# Patient Record
Sex: Male | Born: 1945 | Race: Black or African American | Hispanic: No | Marital: Single | Smoking: Never smoker
Health system: Southern US, Community
[De-identification: ages and names within clinical notes are randomized; demographics above are authoritative.]

## PROBLEM LIST (undated history)

## (undated) DIAGNOSIS — K219 Gastro-esophageal reflux disease without esophagitis: Secondary | ICD-10-CM

## (undated) DIAGNOSIS — D649 Anemia, unspecified: Secondary | ICD-10-CM

## (undated) DIAGNOSIS — F0390 Unspecified dementia without behavioral disturbance: Secondary | ICD-10-CM

## (undated) DIAGNOSIS — E119 Type 2 diabetes mellitus without complications: Secondary | ICD-10-CM

## (undated) DIAGNOSIS — N289 Disorder of kidney and ureter, unspecified: Secondary | ICD-10-CM

## (undated) DIAGNOSIS — G309 Alzheimer's disease, unspecified: Secondary | ICD-10-CM

## (undated) DIAGNOSIS — C61 Malignant neoplasm of prostate: Secondary | ICD-10-CM

## (undated) DIAGNOSIS — N179 Acute kidney failure, unspecified: Secondary | ICD-10-CM

## (undated) DIAGNOSIS — F0280 Dementia in other diseases classified elsewhere without behavioral disturbance: Secondary | ICD-10-CM

## (undated) DIAGNOSIS — I10 Essential (primary) hypertension: Secondary | ICD-10-CM

## (undated) DIAGNOSIS — E785 Hyperlipidemia, unspecified: Secondary | ICD-10-CM

## (undated) HISTORY — PX: PROSTATE BIOPSY: SHX241

---

## 1999-10-20 ENCOUNTER — Ambulatory Visit (HOSPITAL_COMMUNITY): Admission: RE | Admit: 1999-10-20 | Discharge: 1999-10-20 | Payer: Self-pay | Admitting: Pulmonary Disease

## 1999-10-20 ENCOUNTER — Encounter: Payer: Self-pay | Admitting: Pulmonary Disease

## 2000-08-05 ENCOUNTER — Ambulatory Visit (HOSPITAL_COMMUNITY): Admission: RE | Admit: 2000-08-05 | Discharge: 2000-08-05 | Payer: Self-pay | Admitting: Pulmonary Disease

## 2000-08-05 ENCOUNTER — Encounter: Payer: Self-pay | Admitting: Pulmonary Disease

## 2002-07-03 ENCOUNTER — Encounter: Payer: Self-pay | Admitting: Pulmonary Disease

## 2002-07-03 ENCOUNTER — Inpatient Hospital Stay (HOSPITAL_COMMUNITY): Admission: AD | Admit: 2002-07-03 | Discharge: 2002-07-06 | Payer: Self-pay | Admitting: Pulmonary Disease

## 2002-07-05 ENCOUNTER — Encounter: Payer: Self-pay | Admitting: Pulmonary Disease

## 2002-07-06 ENCOUNTER — Encounter: Payer: Self-pay | Admitting: Pulmonary Disease

## 2005-07-06 ENCOUNTER — Ambulatory Visit (HOSPITAL_COMMUNITY): Admission: RE | Admit: 2005-07-06 | Discharge: 2005-07-06 | Payer: Self-pay | Admitting: Pulmonary Disease

## 2008-10-02 ENCOUNTER — Ambulatory Visit (HOSPITAL_COMMUNITY): Admission: RE | Admit: 2008-10-02 | Discharge: 2008-10-02 | Payer: Self-pay | Admitting: Pulmonary Disease

## 2010-08-29 ENCOUNTER — Encounter: Payer: Self-pay | Admitting: Pulmonary Disease

## 2010-12-24 NOTE — Discharge Summary (Signed)
NAMEROMAINE, Cory Ramos                            ACCOUNT NO.:  [id number]   MEDICAL RECORD NO.:  [id number]                   PATIENT TYPE:  INP   LOCATION:  4736                                 FACILITY:  [facility]   PHYSICIAN:  Mina Marble, M.D.          DATE OF BIRTH:  06/21/1946   DATE OF ADMISSION:  07/03/2002  DATE OF DISCHARGE:  07/06/2002                                 DISCHARGE SUMMARY   ADMITTING DIAGNOSES:  1. Chest pain, anterior, with unstable angina suspected.  2. Type 2 diabetes mellitus, fair control.  3. Hypertensive heart disease.  4. Musculoskeletal chest pain.  5. Gastroesophageal reflux disease.   DISCHARGE DIAGNOSES:  1. Chest pain, angina, unstable, resolved.  2. Type 2 diabetes mellitus.  3. Hypertensive heart disease, improved.  4. Gastroesophageal reflux disease, hiatal hernia clinically.  5. Degenerative joint disease of extremities.  6. Musculoskeletal left anterior chest pain.   REASON FOR HOSPITALIZATION:  This is a 65 year old black male with known  type 2 diabetes mellitus on oral diabetic medication and hypertensive heart  disease, who is admitted with complaints of anterior substernal chest pain  radiating down the left arm has remained current in spite of use of his  Pepto-Bismol liquid he usually uses for acid reflux.  Substernal discomfort  for two weeks.  The patient also takes Nexium 40 mg capsule q.d. for acid  reflux.  He also complains of anterior wall pain that has been a long-time  problem for many years, but that is improved with Ben-Gay rub, but the new  pain is different and he is admitted for further evaluation and treatment.   ALLERGIES:  No known drug allergies.   LABORATORIES/STUDIES:  EKG on July 04, 2002, showed normal sinus rhythm,  nonspecific ST-T changes.  Chest x-ray on November 26, showed cardiomegaly  and early right lower lobe infiltrate.  A barium swallow on July 06, 2002, showed small hiatal hernia  without evidence of reflux.  There is no  evidence of mass effect musculature.  On July 04, 2002, CBC was within  normal limits, except hematocrit 37.3.  On November 26, chemistries within  normal limits, except glucose of 159.  On July 03, 2002, the first set  of cardiac enzymes show a CK of 216, CK MB of 2.9, index 1.3, troponin I  0.01.  The next two sets were within normal limits.  Liver profile,  cholesterol was 197, triglycerides were 137, HDL was 59, LDL was 119.   HOSPITAL COURSE:  The patient was admitted.  EKGs were obtained.  The  patient was admitted to  telemetry bed.  The patient was started on a  heparin drip and PTT was monitored.  He was started on hypertensive  management of Avapro and Norvasc.  He was given Darvocet-N 100 for pain and  also started on Plavix 75 mg and Imdur 30 mg p.o. q.d.  He had  a Cardiolite  stress test that was read by Dr. Algie Coffer which was negative for ischemia.  The heparin was discontinued.  The patient was continued on Protonix and  Pepto-Bismol and continued on his diabetic medications of Avandia and  Glyburide.  His Avapro was discontinued.  He was started on Lotensin 20 mg  p.o. q.d.  Added to his prior care also was Lipitor  10 mg q.h.s. and hydrochlorothiazide 12.5 tablets Monday, Wednesday, Friday.  Potassium supplement was added p.o.  The patient was able to progress to  have his ambulation in the hall.  He also had a barium swallow and upper GI  which was negative for mass or reflux, but he did have a small hiatal  hernia.  The patient was started on Bextra 10 mg p.o. q.d. and baby aspirin  81 mg tablets 1 q.d. with food.   CONDITION ON DISCHARGE:  The patient was discharged home and states he  continued having improvement of substernal chest pain.   DISCHARGE MEDICATIONS:  1. Norvasc 10 mg tablet 1 q.d.  2. Lotensin 20 mg tablet 1 q.d.  3. Baby aspirin 81 mg tablet 1 q.d. with food.  4. Plavix 75 mg tablet as directed which  is 1 daily p.r.n. for chest pain     radiating to the left arm.  5. Bextra 10 mg tablet 1 p.o. q.d.  6. _______ 12.5 mg tablet 1 q.d. with food for muscle or joint aches.  7. Glucovance 5/500 tablet 2 q.a.m. and 1 q.p.m. with meals.  8. Avandia 8 mg tablet 1 q.d. with 5 p.m. dinner meal.  9. Lipitor 10 mg tablet p.o. q.d.  10.      Klor-Con 20 mEq tablet 1 q.d. with breakfast.  11.      _______/HCT tablet Monday, Wednesday, Friday.  12.      Nexium 40 mg tablet 1 q.d. acid reflux.  13.      Pepto-Bismol 30 cc before meals and at bedtime with chest burning     and acid reflux.   DISCHARGE INSTRUCTIONS:  Diet:  1800 calorie ADA diet, no added salt.  Followup:  Keep February 2004 appointment.     Dionne D. Retta Mac, M.D.    DDR/MEDQ  D:  08/05/2002  T:  08/05/2002  Job:  [id number]

## 2011-03-24 ENCOUNTER — Other Ambulatory Visit (HOSPITAL_COMMUNITY): Payer: Self-pay | Admitting: Pulmonary Disease

## 2011-04-26 ENCOUNTER — Ambulatory Visit (HOSPITAL_COMMUNITY)
Admission: RE | Admit: 2011-04-26 | Discharge: 2011-04-26 | Disposition: A | Discharge status: Home / Self Care | Payer: Medicare Other | Source: Ambulatory Visit | Attending: Pulmonary Disease | Admitting: Pulmonary Disease

## 2011-04-26 ENCOUNTER — Other Ambulatory Visit (HOSPITAL_COMMUNITY): Payer: Self-pay | Admitting: Pulmonary Disease

## 2011-04-26 DIAGNOSIS — M545 Low back pain, unspecified: Secondary | ICD-10-CM | POA: Insufficient documentation

## 2011-04-26 DIAGNOSIS — Q762 Congenital spondylolisthesis: Secondary | ICD-10-CM | POA: Insufficient documentation

## 2011-04-26 DIAGNOSIS — R52 Pain, unspecified: Secondary | ICD-10-CM

## 2011-04-26 DIAGNOSIS — M79609 Pain in unspecified limb: Secondary | ICD-10-CM | POA: Insufficient documentation

## 2013-07-11 ENCOUNTER — Ambulatory Visit (HOSPITAL_COMMUNITY)
Admission: RE | Admit: 2013-07-11 | Discharge: 2013-07-11 | Disposition: A | Discharge status: Home / Self Care | Payer: Medicare Other | Source: Ambulatory Visit | Attending: Pulmonary Disease | Admitting: Pulmonary Disease

## 2013-07-11 ENCOUNTER — Other Ambulatory Visit (HOSPITAL_COMMUNITY): Payer: Self-pay | Admitting: Pulmonary Disease

## 2013-07-11 DIAGNOSIS — M25559 Pain in unspecified hip: Secondary | ICD-10-CM | POA: Insufficient documentation

## 2013-07-11 DIAGNOSIS — R52 Pain, unspecified: Secondary | ICD-10-CM

## 2013-07-11 DIAGNOSIS — R209 Unspecified disturbances of skin sensation: Secondary | ICD-10-CM | POA: Insufficient documentation

## 2015-04-22 ENCOUNTER — Ambulatory Visit: Payer: Medicare Other | Admitting: Podiatry

## 2015-04-28 ENCOUNTER — Ambulatory Visit: Payer: Medicare Other | Admitting: Podiatry

## 2015-09-25 ENCOUNTER — Emergency Department (HOSPITAL_COMMUNITY)
Admission: EM | Admit: 2015-09-25 | Discharge: 2015-09-25 | Disposition: A | Discharge status: Home / Self Care | Payer: Medicare Other | Source: Home / Self Care | Attending: Family Medicine | Admitting: Family Medicine

## 2015-09-25 ENCOUNTER — Encounter (HOSPITAL_COMMUNITY): Payer: Self-pay | Admitting: Emergency Medicine

## 2015-09-25 DIAGNOSIS — H1132 Conjunctival hemorrhage, left eye: Secondary | ICD-10-CM | POA: Diagnosis not present

## 2015-09-25 NOTE — ED Provider Notes (Signed)
CSN: [id number]     Arrival date & time 09/25/15  1410 History   First MD Initiated Contact with Patient 09/25/15 1552     Chief Complaint  Patient presents with  . Eye Problem   (Consider location/radiation/quality/duration/timing/severity/associated sxs/prior Treatment) HPI Comments: 70 year old male that awoke this morning with redness to the sclera on the left eye. Denies associated pain, vision changes, headache, or other symptoms. Denies pain around the eye. Denies any known trauma. His only other complaint is nasal stuffiness and pressure in the paranasal sinuses. No runny nose.   History reviewed. No pertinent past medical history. History reviewed. No pertinent past surgical history. No family history on file. Social History  Substance Use Topics  . Smoking status: None  . Smokeless tobacco: None  . Alcohol Use: None    Review of Systems  Constitutional: Negative.   HENT: Negative.   Eyes: Positive for redness. Negative for photophobia, pain, discharge, itching and visual disturbance.  Skin: Negative.   Neurological: Negative.   Psychiatric/Behavioral: Negative.     Allergies  Review of patient's allergies indicates no known allergies.  Home Medications   Prior to Admission medications   Not on File   Meds Ordered and Administered this Visit  Medications - No data to display  BP 164/76 mmHg  Pulse 66  Temp(Src) 97.6 F (36.4 C) (Oral)  Resp 14  SpO2 98% No data found.   Physical Exam  Constitutional: He is oriented to person, place, and time. He appears well-developed and well-nourished. No distress.  Eyes: EOM are normal. Pupils are equal, round, and reactive to light.  Medial and inferior subconjunctival hemorrhage of the left eye. no hyphema, anterior chamber is clear. Normal pupillary reaction.  Neck: Normal range of motion. Neck supple.  Cardiovascular: Normal rate.   Pulmonary/Chest: Effort normal. No respiratory distress.  Musculoskeletal: He  exhibits no edema.  Neurological: He is alert and oriented to person, place, and time. He exhibits normal muscle tone.  Skin: Skin is warm and dry.  Psychiatric: He has a normal mood and affect.  Nursing note and vitals reviewed.   ED Course  Procedures (including critical care time)  Labs Review Labs Reviewed - No data to display  Imaging Review No results found.   Visual Acuity Review  Right Eye Distance:   Left Eye Distance:   Bilateral Distance:    Right Eye Near:   Left Eye Near:    Bilateral Near:         MDM   1. Subconjunctival hemorrhage of left eye    Info on dx given    Janne Napoleon, NP 09/25/15 1641

## 2015-09-25 NOTE — Discharge Instructions (Signed)
Subconjunctival Hemorrhage °Subconjunctival hemorrhage is bleeding that happens between the white part of your eye (sclera) and the clear membrane that covers the outside of your eye (conjunctiva). There are many tiny blood vessels near the surface of your eye. A subconjunctival hemorrhage happens when one or more of these vessels breaks and bleeds, causing a red patch to appear on your eye. This is similar to a bruise. °Depending on the amount of bleeding, the red patch may only cover a small area of your eye or it may cover the entire visible part of the sclera. If a lot of blood collects under the conjunctiva, there may also be swelling. Subconjunctival hemorrhages do not affect your vision or cause pain, but your eye may feel irritated if there is swelling. Subconjunctival hemorrhages usually do not require treatment, and they disappear on their own within two weeks. °CAUSES °This condition may be caused by: °· Mild trauma, such as rubbing your eye too hard. °· Severe trauma or blunt injuries. °· Coughing, sneezing, or vomiting. °· Straining, such as when lifting a heavy object. °· High blood pressure. °· Recent eye surgery. °· A history of diabetes. °· Certain medicines, especially blood thinners (anticoagulants). °· Other conditions, such as eye tumors, bleeding disorders, or blood vessel abnormalities. °Subconjunctival hemorrhages can happen without an obvious cause.  °SYMPTOMS  °Symptoms of this condition include: °· A bright red or dark red patch on the white part of the eye. °¨ The red area may spread out to cover a larger area of the eye before it goes away. °¨ The red area may turn brownish-yellow before it goes away. °· Swelling. °· Mild eye irritation. °DIAGNOSIS °This condition is diagnosed with a physical exam. If your subconjunctival hemorrhage was caused by trauma, your health care provider may refer you to an eye specialist (ophthalmologist) or another specialist to check for other injuries. You  may have other tests, including: °· An eye exam. °· A blood pressure check. °· Blood tests to check for bleeding disorders. °If your subconjunctival hemorrhage was caused by trauma, X-rays or a CT scan may be done to check for other injuries. °TREATMENT °Usually, no treatment is needed. Your health care provider may recommend eye drops or cold compresses to help with discomfort. °HOME CARE INSTRUCTIONS °· Take over-the-counter and prescription medicines only as directed by your health care provider. °· Use eye drops or cold compresses to help with discomfort as directed by your health care provider. °· Avoid activities, things, and environments that may irritate or injure your eye. °· Keep all follow-up visits as told by your health care provider. This is important. °SEEK MEDICAL CARE IF: °· You have pain in your eye. °· The bleeding does not go away within 3 weeks. °· You keep getting new subconjunctival hemorrhages. °SEEK IMMEDIATE MEDICAL CARE IF: °· Your vision changes or you have difficulty seeing. °· You suddenly develop severe sensitivity to light. °· You develop a severe headache, persistent vomiting, confusion, or abnormal tiredness (lethargy). °· Your eye seems to bulge or protrude from your eye socket. °· You develop unexplained bruises on your body. °· You have unexplained bleeding in another area of your body. °  °This information is not intended to replace advice given to you by your health care provider. Make sure you discuss any questions you have with your health care provider. °  °Document Released: 07/25/2005 Document Revised: 04/15/2015 Document Reviewed: 10/01/2014 °Elsevier Interactive Patient Education ©2016 [employer] ° °

## 2015-09-25 NOTE — ED Notes (Signed)
Pt reports he woke up today w/redness of left eye... States he was fine yest night Denies inj/trauma/pain/blurry vision Also reports recent family stress/death in family A&O x4... No acute distress.

## 2016-02-23 ENCOUNTER — Other Ambulatory Visit (HOSPITAL_COMMUNITY): Payer: Self-pay | Admitting: Pulmonary Disease

## 2016-02-23 DIAGNOSIS — R0989 Other specified symptoms and signs involving the circulatory and respiratory systems: Secondary | ICD-10-CM

## 2016-02-26 ENCOUNTER — Inpatient Hospital Stay (HOSPITAL_COMMUNITY): Admission: RE | Admit: 2016-02-26 | Payer: Medicare Other | Source: Ambulatory Visit

## 2016-03-03 ENCOUNTER — Encounter (HOSPITAL_COMMUNITY): Payer: Medicare Other

## 2016-03-04 ENCOUNTER — Ambulatory Visit (HOSPITAL_COMMUNITY)
Admission: RE | Admit: 2016-03-04 | Discharge: 2016-03-04 | Disposition: A | Discharge status: Home / Self Care | Payer: Medicare Other | Source: Ambulatory Visit | Attending: Cardiology | Admitting: Cardiology

## 2016-03-04 DIAGNOSIS — R0989 Other specified symptoms and signs involving the circulatory and respiratory systems: Secondary | ICD-10-CM | POA: Insufficient documentation

## 2016-07-25 ENCOUNTER — Ambulatory Visit (HOSPITAL_COMMUNITY)
Admission: EM | Admit: 2016-07-25 | Discharge: 2016-07-25 | Disposition: A | Discharge status: Home / Self Care | Payer: Medicare Other | Attending: Family Medicine | Admitting: Family Medicine

## 2016-07-25 ENCOUNTER — Encounter (HOSPITAL_COMMUNITY): Payer: Self-pay | Admitting: Emergency Medicine

## 2016-07-25 DIAGNOSIS — K219 Gastro-esophageal reflux disease without esophagitis: Secondary | ICD-10-CM | POA: Diagnosis not present

## 2016-07-25 HISTORY — DX: Essential (primary) hypertension: I10

## 2016-07-25 HISTORY — DX: Type 2 diabetes mellitus without complications: E11.9

## 2016-07-25 HISTORY — DX: Gastro-esophageal reflux disease without esophagitis: K21.9

## 2016-07-25 MED ORDER — RANITIDINE HCL 150 MG PO TABS: 150.0000 mg | ORAL_TABLET | Freq: Every day | ORAL | 0 refills | Status: DC

## 2016-07-25 NOTE — Discharge Instructions (Signed)
Avoid acidic or spicey foods, follow up with primary care for symptom management.

## 2016-07-25 NOTE — ED Provider Notes (Signed)
CSN: [id number]     Arrival date & time 07/25/16  1504 History   First MD Initiated Contact with Patient 07/25/16 1540     Chief Complaint  Patient presents with  . Gastroesophageal Reflux   (Consider location/radiation/quality/duration/timing/severity/associated sxs/prior Treatment) Patient presents with history of GERD. States he has had symptoms for several months but has worsened over the last few weeks. Describes a burning pain mid sternal radiating to left shoulder, worsened with foods such as tomatoes, onion, and peppers. Takes pepto bismol and tums nightly with some relief, worse when laying down, has had some cough and sensation of reflux in the back of his throat.   The history is provided by the patient.  Gastroesophageal Reflux  This is a chronic problem. The current episode started more than 1 week ago. The problem occurs every several days. The problem has been gradually worsening. Associated symptoms include chest pain. Pertinent negatives include no abdominal pain and no shortness of breath. The symptoms are aggravated by eating. The symptoms are relieved by medications. The treatment provided mild relief.    Past Medical History:  Diagnosis Date  . Diabetes mellitus without complication ([place])   . GERD (gastroesophageal reflux disease)   . Hypertension    History reviewed. No pertinent surgical history. History reviewed. No pertinent family history. Social History  Substance Use Topics  . Smoking status: Never Smoker  . Smokeless tobacco: Never Used  . Alcohol use No    Review of Systems  Constitutional: Negative for activity change, appetite change, chills, fatigue and fever.  HENT: Positive for sore throat. Negative for congestion, postnasal drip, rhinorrhea and voice change.   Respiratory: Positive for cough. Negative for apnea, chest tightness, shortness of breath and wheezing.   Cardiovascular: Positive for chest pain.  Gastrointestinal: Negative for  abdominal distention, abdominal pain, constipation, diarrhea, nausea and vomiting.  Neurological: Negative for dizziness, weakness and light-headedness.    Allergies  Patient has no known allergies.  Home Medications   Prior to Admission medications   Medication Sig Start Date End Date Taking? Authorizing Provider  amiloride-hydrochlorothiazide (MODURETIC) 5-50 MG tablet Take by mouth.   Yes Historical Provider, MD  amLODipine-olmesartan (AZOR) 5-20 MG tablet Take 1 tablet by mouth daily.   Yes Historical Provider, MD  aspirin EC 81 MG tablet Take 81 mg by mouth daily.   Yes Historical Provider, MD  benazepril (LOTENSIN) 40 MG tablet Take 40 mg by mouth daily.   Yes Historical Provider, MD  colesevelam (WELCHOL) 625 MG tablet Take by mouth 2 (two) times daily with a meal.   Yes Historical Provider, MD  ezetimibe (ZETIA) 10 MG tablet Take 10 mg by mouth daily.   Yes Historical Provider, MD  fenofibrate micronized (LOFIBRA) 134 MG capsule Take 134 mg by mouth daily before breakfast.   Yes Historical Provider, MD  glipiZIDE-metformin (METAGLIP) 5-500 MG tablet Take 1 tablet by mouth 2 (two) times daily before a meal.   Yes Historical Provider, MD  omeprazole (PRILOSEC) 20 MG capsule Take 20 mg by mouth 2 (two) times daily before a meal.   Yes Historical Provider, MD  pioglitazone (ACTOS) 30 MG tablet Take 30 mg by mouth daily.   Yes Historical Provider, MD  tamsulosin (FLOMAX) 0.4 MG CAPS capsule Take 0.4 mg by mouth.   Yes Historical Provider, MD  Vitamin D, Ergocalciferol, (DRISDOL) 50000 units CAPS capsule Take 50,000 Units by mouth every 7 (seven) days.   Yes Historical Provider, MD  ranitidine (ZANTAC) 150 MG  tablet Take 1 tablet (150 mg total) by mouth at bedtime. 07/25/16 08/08/16  Barnet Glasgow, NP   Meds Ordered and Administered this Visit  Medications - No data to display  BP 163/59 (BP Location: Left Arm)   Pulse 81   Temp 98.6 F (37 C) (Oral)   Resp 18   SpO2 97%  No data  found.   Physical Exam  Constitutional: He is oriented to person, place, and time. He appears well-developed and well-nourished. No distress.  HENT:  Head: Normocephalic.  Eyes: Pupils are equal, round, and reactive to light.  Neck: Normal range of motion. Neck supple.  Cardiovascular: Normal rate, regular rhythm, S1 normal, S2 normal, normal heart sounds and intact distal pulses.   Pulses:      Radial pulses are 2+ on the right side.  Pulmonary/Chest: Effort normal and breath sounds normal. No respiratory distress. He has no wheezes. He has no rales.  Abdominal: Soft. Bowel sounds are normal. He exhibits no distension. There is no tenderness. There is no guarding.  Lymphadenopathy:    He has no cervical adenopathy.  Neurological: He is alert and oriented to person, place, and time.  Skin: Skin is warm and dry. He is not diaphoretic. No erythema.  Nursing note and vitals reviewed.   Urgent Care Course   Clinical Course     Procedures (including critical care time)  Labs Review Labs Reviewed - No data to display  12 lead EKG: Normal sinus rhythm without acute findings  Imaging Review No results found.   Visual Acuity Review  Right Eye Distance:   Left Eye Distance:   Bilateral Distance:    Right Eye Near:   Left Eye Near:    Bilateral Near:         MDM   1. Gastroesophageal reflux disease, esophagitis presence not specified    GERD Symptoms, RX Ranitidine 150 mg QHS, avoid spicy foods and acidic foods, follow-up with primary care for further management.     Barnet Glasgow, NP 07/25/16 [phone]

## 2016-07-25 NOTE — ED Triage Notes (Signed)
The patient presented to the [facility] with a complaint of recurrent GERD. The patient reported that it has gotten increasingly worse over the last month.

## 2016-12-27 ENCOUNTER — Other Ambulatory Visit (HOSPITAL_COMMUNITY): Payer: Medicare Other

## 2016-12-27 ENCOUNTER — Inpatient Hospital Stay (HOSPITAL_COMMUNITY): Payer: Medicare Other

## 2016-12-27 ENCOUNTER — Encounter (HOSPITAL_COMMUNITY): Payer: Self-pay | Admitting: Emergency Medicine

## 2016-12-27 ENCOUNTER — Observation Stay (HOSPITAL_COMMUNITY)
Admission: EM | Admit: 2016-12-27 | Discharge: 2016-12-29 | Disposition: A | Discharge status: Home / Self Care | Payer: Medicare Other | Attending: Internal Medicine | Admitting: Internal Medicine

## 2016-12-27 DIAGNOSIS — E875 Hyperkalemia: Secondary | ICD-10-CM

## 2016-12-27 DIAGNOSIS — Z7984 Long term (current) use of oral hypoglycemic drugs: Secondary | ICD-10-CM | POA: Diagnosis not present

## 2016-12-27 DIAGNOSIS — E785 Hyperlipidemia, unspecified: Secondary | ICD-10-CM | POA: Diagnosis not present

## 2016-12-27 DIAGNOSIS — N179 Acute kidney failure, unspecified: Principal | ICD-10-CM | POA: Insufficient documentation

## 2016-12-27 DIAGNOSIS — N183 Chronic kidney disease, stage 3 (moderate): Secondary | ICD-10-CM | POA: Diagnosis not present

## 2016-12-27 DIAGNOSIS — I129 Hypertensive chronic kidney disease with stage 1 through stage 4 chronic kidney disease, or unspecified chronic kidney disease: Secondary | ICD-10-CM | POA: Insufficient documentation

## 2016-12-27 DIAGNOSIS — I10 Essential (primary) hypertension: Secondary | ICD-10-CM | POA: Diagnosis not present

## 2016-12-27 DIAGNOSIS — D649 Anemia, unspecified: Secondary | ICD-10-CM

## 2016-12-27 DIAGNOSIS — E1122 Type 2 diabetes mellitus with diabetic chronic kidney disease: Secondary | ICD-10-CM

## 2016-12-27 DIAGNOSIS — F0390 Unspecified dementia without behavioral disturbance: Secondary | ICD-10-CM | POA: Diagnosis not present

## 2016-12-27 DIAGNOSIS — Z79899 Other long term (current) drug therapy: Secondary | ICD-10-CM | POA: Diagnosis not present

## 2016-12-27 DIAGNOSIS — K219 Gastro-esophageal reflux disease without esophagitis: Secondary | ICD-10-CM | POA: Diagnosis not present

## 2016-12-27 DIAGNOSIS — E872 Acidosis: Secondary | ICD-10-CM | POA: Diagnosis not present

## 2016-12-27 DIAGNOSIS — E1165 Type 2 diabetes mellitus with hyperglycemia: Secondary | ICD-10-CM

## 2016-12-27 DIAGNOSIS — Z7982 Long term (current) use of aspirin: Secondary | ICD-10-CM | POA: Insufficient documentation

## 2016-12-27 DIAGNOSIS — N289 Disorder of kidney and ureter, unspecified: Secondary | ICD-10-CM

## 2016-12-27 DIAGNOSIS — IMO0002 Reserved for concepts with insufficient information to code with codable children: Secondary | ICD-10-CM | POA: Diagnosis present

## 2016-12-27 DIAGNOSIS — E1129 Type 2 diabetes mellitus with other diabetic kidney complication: Secondary | ICD-10-CM | POA: Diagnosis present

## 2016-12-27 HISTORY — DX: Hyperlipidemia, unspecified: E78.5

## 2016-12-27 HISTORY — DX: Acute kidney failure, unspecified: N17.9

## 2016-12-27 HISTORY — DX: Disorder of kidney and ureter, unspecified: N28.9

## 2016-12-27 HISTORY — DX: Anemia, unspecified: D64.9

## 2016-12-27 HISTORY — DX: Unspecified dementia, unspecified severity, without behavioral disturbance, psychotic disturbance, mood disturbance, and anxiety: F03.90

## 2016-12-27 LAB — RETICULOCYTES
RBC.: 3.12 MIL/uL — AB (ref 4.22–5.81)
Retic Count, Absolute: 37.4 10*3/uL (ref 19.0–186.0)
Retic Ct Pct: 1.2 % (ref 0.4–3.1)

## 2016-12-27 LAB — COMPREHENSIVE METABOLIC PANEL
ALK PHOS: 37 U/L — AB (ref 38–126)
ALT: 12 U/L — ABNORMAL LOW (ref 17–63)
ANION GAP: 5 (ref 5–15)
AST: 20 U/L (ref 15–41)
Albumin: 3.6 g/dL (ref 3.5–5.0)
BILIRUBIN TOTAL: 0.5 mg/dL (ref 0.3–1.2)
BUN: 53 mg/dL — ABNORMAL HIGH (ref 6–20)
CALCIUM: 9.2 mg/dL (ref 8.9–10.3)
CO2: 22 mmol/L (ref 22–32)
Chloride: 113 mmol/L — ABNORMAL HIGH (ref 101–111)
Creatinine, Ser: 3.54 mg/dL — ABNORMAL HIGH (ref 0.61–1.24)
GFR calc Af Amer: 18 mL/min — ABNORMAL LOW (ref >60)
GFR calc non Af Amer: 16 mL/min — ABNORMAL LOW (ref >60)
Glucose, Bld: 162 mg/dL — ABNORMAL HIGH (ref 65–99)
Potassium: 5.8 mmol/L — ABNORMAL HIGH (ref 3.5–5.1)
SODIUM: 140 mmol/L (ref 135–145)
TOTAL PROTEIN: 6.4 g/dL — AB (ref 6.5–8.1)

## 2016-12-27 LAB — IRON AND TIBC
Iron: 50 ug/dL (ref 45–182)
SATURATION RATIOS: 12 % — AB (ref 17.9–39.5)
TIBC: 434 ug/dL (ref 250–450)
UIBC: 384 ug/dL

## 2016-12-27 LAB — CBC
HCT: 27.4 % — ABNORMAL LOW (ref 39.0–52.0)
HEMOGLOBIN: 8.6 g/dL — AB (ref 13.0–17.0)
MCH: 29.5 pg (ref 26.0–34.0)
MCHC: 31.4 g/dL (ref 30.0–36.0)
MCV: 93.8 fL (ref 78.0–100.0)
Platelets: 215 10*3/uL (ref 150–400)
RBC: 2.92 MIL/uL — ABNORMAL LOW (ref 4.22–5.81)
RDW: 15.2 % (ref 11.5–15.5)
WBC: 6.4 10*3/uL (ref 4.0–10.5)

## 2016-12-27 LAB — FOLATE: FOLATE: 12.8 ng/mL (ref >5.9)

## 2016-12-27 LAB — VITAMIN B12: VITAMIN B 12: 238 pg/mL (ref 180–914)

## 2016-12-27 LAB — GLUCOSE, CAPILLARY: Glucose-Capillary: 165 mg/dL — ABNORMAL HIGH (ref 65–99)

## 2016-12-27 LAB — FERRITIN: Ferritin: 109 ng/mL (ref 24–336)

## 2016-12-27 LAB — LIPASE, BLOOD: Lipase: 73 U/L — ABNORMAL HIGH (ref 11–51)

## 2016-12-27 MED ORDER — SODIUM CHLORIDE 0.9 % IV SOLN
INTRAVENOUS | Status: DC
  Administered 2016-12-27 – 2016-12-28 (×3): via INTRAVENOUS

## 2016-12-27 MED ORDER — TAMSULOSIN HCL 0.4 MG PO CAPS
0.4000 mg | ORAL_CAPSULE | Freq: Every day | ORAL | Status: DC
  Administered 2016-12-27 – 2016-12-29 (×3): 0.4 mg via ORAL
  Filled 2016-12-27 (×3): qty 1

## 2016-12-27 MED ORDER — HEPARIN SODIUM (PORCINE) 5000 UNIT/ML IJ SOLN
5000.0000 [IU] | Freq: Three times a day (TID) | INTRAMUSCULAR | Status: DC
  Administered 2016-12-28 – 2016-12-29 (×4): 5000 [IU] via SUBCUTANEOUS
  Filled 2016-12-27 (×5): qty 1

## 2016-12-27 MED ORDER — ONDANSETRON HCL 4 MG PO TABS: 4.0000 mg | ORAL_TABLET | Freq: Four times a day (QID) | ORAL | Status: DC | PRN

## 2016-12-27 MED ORDER — EZETIMIBE 10 MG PO TABS
10.0000 mg | ORAL_TABLET | Freq: Every day | ORAL | Status: DC
  Administered 2016-12-27 – 2016-12-29 (×3): 10 mg via ORAL
  Filled 2016-12-27 (×3): qty 1

## 2016-12-27 MED ORDER — INSULIN ASPART 100 UNIT/ML ~~LOC~~ SOLN
0.0000 [IU] | Freq: Three times a day (TID) | SUBCUTANEOUS | Status: DC
  Administered 2016-12-28: 1 [IU] via SUBCUTANEOUS

## 2016-12-27 MED ORDER — SODIUM CHLORIDE 0.9 % IV BOLUS (SEPSIS)
500.0000 mL | Freq: Once | INTRAVENOUS | Status: AC
Start: 2016-12-27 — End: 2016-12-27
  Administered 2016-12-27: 500 mL via INTRAVENOUS

## 2016-12-27 MED ORDER — ASPIRIN EC 81 MG PO TBEC
81.0000 mg | DELAYED_RELEASE_TABLET | Freq: Every day | ORAL | Status: DC
  Administered 2016-12-27 – 2016-12-29 (×3): 81 mg via ORAL
  Filled 2016-12-27 (×3): qty 1

## 2016-12-27 MED ORDER — ACETAMINOPHEN 650 MG RE SUPP: 650.0000 mg | Freq: Four times a day (QID) | RECTAL | Status: DC | PRN

## 2016-12-27 MED ORDER — ONDANSETRON HCL 4 MG/2ML IJ SOLN: 4.0000 mg | Freq: Four times a day (QID) | INTRAMUSCULAR | Status: DC | PRN

## 2016-12-27 MED ORDER — SODIUM POLYSTYRENE SULFONATE 15 GM/60ML PO SUSP
15.0000 g | Freq: Once | ORAL | Status: AC
  Administered 2016-12-27: 15 g via ORAL
  Filled 2016-12-27: qty 60

## 2016-12-27 MED ORDER — ACETAMINOPHEN 325 MG PO TABS: 650.0000 mg | ORAL_TABLET | Freq: Four times a day (QID) | ORAL | Status: DC | PRN

## 2016-12-27 MED ORDER — PANTOPRAZOLE SODIUM 40 MG PO TBEC
40.0000 mg | DELAYED_RELEASE_TABLET | Freq: Every day | ORAL | Status: DC
  Administered 2016-12-27 – 2016-12-29 (×3): 40 mg via ORAL
  Filled 2016-12-27 (×3): qty 1

## 2016-12-27 NOTE — ED Triage Notes (Signed)
Pt here for eval of possible renal failure; pt sts back pain x 1 week

## 2016-12-27 NOTE — H&P (Signed)
History and Physical    Cory Ramos NWG:[id number] DOB: 02-08-1946 DOA: 12/27/2016  Referring MD/NP/PA: EDP PCP:  Patient coming from: Home  Chief Complaint: abnormal labs  HPI: Cory Ramos is a 71 y.o. male with medical history significant of DM, CKD unknown baseline, HTN, dyslipidemia, Mild Dementia, BPH was sent to the ER by his MD from [facility]. patient is a very poor historian who reports that he had labs done by his PCP through the [place] [facility] about 1-2 weeks ago, he reports getting a call from his PCP this morning asking him to come to the nurse emergency room for further workup of kidney disease. His wife is at bedside who reports that she is at work during the daytime and hence does not know if they called him last week and if he just doesn't recall the instructions then because of his dementia. Patient denies any NSAID use, no history of nausea vomiting or diarrhea no history of recent hospitalizations. He reports knowing that he's had some kidney disease for a couple years.  ED Course: Labs note a sodium of 140, potassium of 5.8, creatinine of 3.5, hemoglobin of 8.6   Review of Systems: As per HPI, All other systems reviewed and are negative  Past Medical History:  Diagnosis Date  . Diabetes mellitus without complication ([place])   . GERD (gastroesophageal reflux disease)   . Hypertension     History reviewed. No pertinent surgical history.   reports that he has never smoked. He has never used smokeless tobacco. He reports that he does not drink alcohol or use drugs.  No Known Allergies  Family history. -History of kidney disease in the family, sister was on dialysis   Prior to Admission medications   Medication Sig Start Date End Date Taking? Authorizing Provider  amiloride-hydrochlorothiazide (MODURETIC) 5-50 MG tablet Take by mouth.    [provider]  amLODipine-olmesartan (AZOR) 5-20 MG tablet Take 1 tablet by mouth daily.    [provider]  aspirin EC 81 MG tablet Take 81 mg by mouth daily.    [provider]  benazepril (LOTENSIN) 40 MG tablet Take 40 mg by mouth daily.    [provider]  colesevelam (WELCHOL) 625 MG tablet Take by mouth 2 (two) times daily with a meal.    [provider]  ezetimibe (ZETIA) 10 MG tablet Take 10 mg by mouth daily.    [provider]  fenofibrate micronized (LOFIBRA) 134 MG capsule Take 134 mg by mouth daily before breakfast.    [provider]  glipiZIDE-metformin (METAGLIP) 5-500 MG tablet Take 1 tablet by mouth 2 (two) times daily before a meal.    [provider]  omeprazole (PRILOSEC) 20 MG capsule Take 20 mg by mouth 2 (two) times daily before a meal.    [provider]  pioglitazone (ACTOS) 30 MG tablet Take 30 mg by mouth daily.    [provider]  ranitidine (ZANTAC) 150 MG tablet Take 1 tablet (150 mg total) by mouth at bedtime. 07/25/16 08/08/16  Barnet Glasgow, NP  tamsulosin (FLOMAX) 0.4 MG CAPS capsule Take 0.4 mg by mouth.    [provider]  Vitamin D, Ergocalciferol, (DRISDOL) 50000 units CAPS capsule Take 50,000 Units by mouth every 7 (seven) days.    [provider]    Physical Exam: Vitals:   12/27/16 1219 12/27/16 1425  BP: (!) 160/60 (!) 143/47  Pulse: 75 64  Resp: 18 16  Temp: 98.4 F (  36.9 C)   TempSrc: Oral   SpO2: 99% 100%      Constitutional: NAD, calm, comfortable, oriented to self and place and only partly to time  Vitals:   12/27/16 1219 12/27/16 1425  BP: (!) 160/60 (!) 143/47  Pulse: 75 64  Resp: 18 16  Temp: 98.4 F (36.9 C)   TempSrc: Oral   SpO2: 99% 100%   Eyes: PERRL, lids and conjunctivae normal ENMT: Mucous membranes are moist.  Neck: normal, supple, no masses, no thyromegaly Respiratory: clear to auscultation bilaterally, no wheezing Cardiovascular: Regular rate and rhythm, no murmurs / rubs / gallops. No extremity edema. Abdomen:  no tenderness, no masses palpated. No hepatosplenomegaly. Bowel sounds positive.  Musculoskeletal: no clubbing / cyanosis. No joint deformity upper and lower extremities. Good ROM, no contractures. Normal muscle tone.  Skin: no rashes, lesions, ulcers. No induration Neurologic: CN 2-12 grossly intact. Sensation intact, DTR normal. Strength 5/5 in all 4.  Psychiatric: Normal judgment and insight. Normal mood.   Labs on Admission: I have personally reviewed following labs and imaging studies  CBC:  Recent Labs Lab 12/27/16 1238  WBC 6.4  HGB 8.6*  HCT 27.4*  MCV 93.8  PLT 315   Basic Metabolic Panel:  Recent Labs Lab 12/27/16 1238  NA 140  K 5.8*  CL 113*  CO2 22  GLUCOSE 162*  BUN 53*  CREATININE 3.54*  CALCIUM 9.2   GFR: CrCl cannot be calculated (Unknown ideal weight.). Liver Function Tests:  Recent Labs Lab 12/27/16 1238  AST 20  ALT 12*  ALKPHOS 37*  BILITOT 0.5  PROT 6.4*  ALBUMIN 3.6    Recent Labs Lab 12/27/16 1238  LIPASE 73*   No results for input(s): AMMONIA in the last 168 hours. Coagulation Profile: No results for input(s): INR, PROTIME in the last 168 hours. Cardiac Enzymes: No results for input(s): CKTOTAL, CKMB, CKMBINDEX, TROPONINI in the last 168 hours. BNP (last 3 results) No results for input(s): PROBNP in the last 8760 hours. HbA1C: No results for input(s): HGBA1C in the last 72 hours. CBG: No results for input(s): GLUCAP in the last 168 hours. Lipid Profile: No results for input(s): CHOL, HDL, LDLCALC, TRIG, CHOLHDL, LDLDIRECT in the last 72 hours. Thyroid Function Tests: No results for input(s): TSH, T4TOTAL, FREET4, T3FREE, THYROIDAB in the last 72 hours. Anemia Panel: No results for input(s): VITAMINB12, FOLATE, FERRITIN, TIBC, IRON, RETICCTPCT in the last 72 hours. Urine analysis: No results found for: COLORURINE, APPEARANCEUR, LABSPEC, PHURINE, GLUCOSEU, HGBUR, BILIRUBINUR, KETONESUR, PROTEINUR, UROBILINOGEN, NITRITE,  LEUKOCYTESUR Sepsis Labs: @LABRCNTIP (procalcitonin:4,lacticidven:4) )No results found for this or any previous visit (from the past 240 hour(s)).   Radiological Exams on Admission: No results found.  EKG: Pending  Assessment/Plan Principal Problem:    AKI (acute kidney injury) ([place]) -Patient has known chronic kidney disease for years-likely from diabetes and hypertension, baseline unknown, will request records from [facility] -Clinically does not appear dry but will hydrate overnight with normal saline at 75 mL an hour - patient is on a ACE inhibitor and ARB along with the diuretic which will be held -Check renal ultrasound -Strict I/O's, urine output -Check urinalysis for proteinuria -will need Renal input if AKI doesn't improve  Hyperkalemia  -Due to AKI, ACE and ARB  -Hydrate, hold Ace and ARB  -Kayexalate 1 now   Dementia -stable, resume namenda when dose confirmed     DM (diabetes mellitus), type 2, uncontrolled, with renal complications (HCC) -Hold oral hypoglycemics -Sliding-scale insulin for now  Essential hypertension -Hold Ace and ARB, use hydralazine when necessary    Anemia -Likely from CKD check anemia panel   DVT prophylaxis: Hep SQ Code Status: Full Code  Family Communication: Wife at bedside  Disposition Plan: home when AKi better Consults called: none  Admission status: inpatient  Domenic Polite MD [facility] Pager [phone]  If 7PM-7AM, please contact night-coverage [email or web address] Password [facility]  12/27/2016, 5:51 PM

## 2016-12-27 NOTE — ED Provider Notes (Signed)
[place] DEPT Provider Note   CSN: [id number] Arrival date & time: 12/27/16  1214     History   Chief Complaint Chief Complaint  Patient presents with  . Abnormal Lab    HPI Cory Ramos is a 71 y.o. male.  Pt with history with of DM, GERD, HTN who presents to ED with increased Cr that was found by [facility] doctor. Patient has been started on lasix over the last month for leg swelling, denies heart failure.    The history is provided by the patient.  Illness  This is a new problem. The current episode started yesterday. The problem occurs constantly. The problem has not changed since onset.Pertinent negatives include no chest pain, no abdominal pain, no headaches and no shortness of breath. Nothing aggravates the symptoms. Nothing relieves the symptoms. He has tried nothing for the symptoms.    Past Medical History:  Diagnosis Date  . Acute kidney injury ([place]) 12/27/2016  . Anemia 12/27/2016  . Dementia   . Diabetes mellitus without complication ([place])   . GERD (gastroesophageal reflux disease)   . Hyperlipidemia   . Hypertension   . Kidney disease 12/27/2016   BUN 53 Creat 3.54    Patient Active Problem List   Diagnosis Date Noted  . DM (diabetes mellitus), type 2, uncontrolled, with renal complications ([place]) 02/54/2706  . AKI (acute kidney injury) ([place]) 12/27/2016  . Hyperkalemia 12/27/2016  . GERD (gastroesophageal reflux disease) 12/27/2016  . Essential hypertension 12/27/2016  . Anemia 12/27/2016    History reviewed. No pertinent surgical history.     Home Medications    Prior to Admission medications   Medication Sig Start Date End Date Taking? Authorizing Provider  amiloride-hydrochlorothiazide (MODURETIC) 5-50 MG tablet Take 1 tablet by mouth daily.    Yes [provider]  amLODipine-olmesartan (AZOR) 5-20 MG tablet Take 1 tablet by mouth daily.   Yes [provider]  aspirin EC 81 MG tablet Take 81 mg by mouth daily.   Yes  [provider]  benazepril (LOTENSIN) 40 MG tablet Take 40 mg by mouth daily.   Yes [provider]  Choline Fenofibrate (TRILIPIX) 135 MG capsule Take 135 mg by mouth at bedtime.   Yes [provider]  colesevelam (WELCHOL) 625 MG tablet Take 1,250 mg by mouth 2 (two) times daily with a meal.    Yes [provider]  donepezil (ARICEPT) 5 MG tablet Take 5 mg by mouth at bedtime.   Yes [provider]  ezetimibe (ZETIA) 10 MG tablet Take 10 mg by mouth daily.   Yes [provider]  furosemide (LASIX) 40 MG tablet Take 40 mg by mouth 2 (two) times daily.   Yes [provider]  gabapentin (NEURONTIN) 300 MG capsule Take 300 mg by mouth every 8 (eight) hours.   Yes [provider]  glipiZIDE-metformin (METAGLIP) 5-500 MG tablet Take 1-2 tablets by mouth See admin instructions. 2 tablets with breakfast then 1 tablet with lunch then 1 tablet with dinner (evening meal)   Yes [provider]  memantine (NAMENDA) 10 MG tablet Take 10 mg by mouth at bedtime.   Yes [provider]  omeprazole (PRILOSEC) 20 MG capsule Take 20 mg by mouth 2 (two) times daily before a meal.   Yes [provider]  pioglitazone (ACTOS) 30 MG tablet Take 30 mg by mouth daily.   Yes [provider]  potassium chloride SA (K-DUR,KLOR-CON) 20 MEQ tablet Take 20 mEq by mouth daily.  Yes [provider]  tamsulosin (FLOMAX) 0.4 MG CAPS capsule Take 0.4 mg by mouth at bedtime.    Yes [provider]  Vitamin D, Ergocalciferol, (DRISDOL) 50000 units CAPS capsule Take 50,000 Units by mouth every 7 (seven) days.   Yes [provider]  ranitidine (ZANTAC) 150 MG tablet Take 1 tablet (150 mg total) by mouth at bedtime. Patient not taking: Reported on 12/27/2016 07/25/16 12/27/16  Barnet Glasgow, NP    Family History Family History  Problem Relation Age of Onset  . Kidney disease Sister     Social  History Social History  Substance Use Topics  . Smoking status: Never Smoker  . Smokeless tobacco: Never Used  . Alcohol use No     Allergies   Patient has no known allergies.   Review of Systems Review of Systems  Constitutional: Negative for chills and fever.  HENT: Negative for ear pain and sore throat.   Eyes: Negative for pain and visual disturbance.  Respiratory: Negative for cough and shortness of breath.   Cardiovascular: Positive for leg swelling. Negative for chest pain and palpitations.  Gastrointestinal: Negative for abdominal pain and vomiting.  Genitourinary: Negative for dysuria and hematuria.  Musculoskeletal: Negative for arthralgias and back pain.  Skin: Negative for color change and rash.  Neurological: Negative for seizures, syncope and headaches.  All other systems reviewed and are negative.    Physical Exam Updated Vital Signs  ED Triage Vitals [12/27/16 1219]  Enc Vitals Group     BP (!) 160/60     Pulse Rate 75     Resp 18     Temp 98.4 F (36.9 C)     Temp Source Oral     SpO2 99 %     Weight      Height      Head Circumference      Peak Flow      Pain Score 5     Pain Loc      Pain Edu?      Excl. in [place]?     Physical Exam  Constitutional: He is oriented to person, place, and time. He appears well-developed and well-nourished.  HENT:  Head: Normocephalic and atraumatic.  Eyes: Conjunctivae and EOM are normal. Pupils are equal, round, and reactive to light.  Neck: Normal range of motion. Neck supple.  Cardiovascular: Normal rate, regular rhythm, normal heart sounds and intact distal pulses.   No murmur heard. Pulmonary/Chest: Effort normal and breath sounds normal. No respiratory distress. He has no wheezes. He has no rales.  Abdominal: Soft. Bowel sounds are normal. He exhibits no distension. There is no tenderness.  Musculoskeletal: Normal range of motion. He exhibits edema (2+ pitting edema b/l).  Neurological: He is alert and  oriented to person, place, and time.  Skin: Skin is warm and dry.  Psychiatric: He has a normal mood and affect.  Nursing note and vitals reviewed.    ED Treatments / Results  Labs (all labs ordered are listed, but only abnormal results are displayed) Labs Reviewed  LIPASE, BLOOD - Abnormal; Notable for the following:       Result Value   Lipase 73 (*)    All other components within normal limits  COMPREHENSIVE METABOLIC PANEL - Abnormal; Notable for the following:    Potassium 5.8 (*)    Chloride 113 (*)    Glucose, Bld 162 (*)    BUN 53 (*)    Creatinine, Ser 3.54 (*)  Total Protein 6.4 (*)    ALT 12 (*)    Alkaline Phosphatase 37 (*)    GFR calc non Af Amer 16 (*)    GFR calc Af Amer 18 (*)    All other components within normal limits  CBC - Abnormal; Notable for the following:    RBC 2.92 (*)    Hemoglobin 8.6 (*)    HCT 27.4 (*)    All other components within normal limits  IRON AND TIBC - Abnormal; Notable for the following:    Saturation Ratios 12 (*)    All other components within normal limits  RETICULOCYTES - Abnormal; Notable for the following:    RBC. 3.12 (*)    All other components within normal limits  GLUCOSE, CAPILLARY - Abnormal; Notable for the following:    Glucose-Capillary 165 (*)    All other components within normal limits  VITAMIN B12  FOLATE  FERRITIN  URINALYSIS, ROUTINE W REFLEX MICROSCOPIC  CBC  CREATININE, SERUM  URINALYSIS, COMPLETE (UACMP) WITH MICROSCOPIC  CBC  COMPREHENSIVE METABOLIC PANEL    EKG  EKG Interpretation  Date/Time:  Tuesday Dec 27 2016 19:25:21 EDT Ventricular Rate:  56 PR Interval:  164 QRS Duration: 82 QT Interval:  406 QTC Calculation: 391 R Axis:   19 Text Interpretation:  Sinus bradycardia Otherwise normal ECG No acute changes No significant change since last tracing Confirmed by Varney Biles ([id number]) on 12/27/2016 7:33:55 PM       Radiology US Renal  Result Date: 12/27/2016 CLINICAL DATA:  71  y/o  M; acute kidney injury. EXAM: RENAL / URINARY TRACT ULTRASOUND COMPLETE COMPARISON:  None. FINDINGS: Right Kidney: Length: 10.8 cm. Echogenicity within normal limits. No mass or hydronephrosis visualized. Simple appearing 1.3 cm interpolar cyst. Left Kidney: Length: 11.4 cm. Echogenicity within normal limits. No mass or hydronephrosis visualized. Bladder: Appears normal for degree of bladder distention. IMPRESSION: No acute process identified.  Unremarkable renal ultrasound. Electronically Signed   By: Kristine Garbe M.D.   On: 12/27/2016 20:03    Procedures Procedures (including critical care time)  Medications Ordered in ED Medications  aspirin EC tablet 81 mg (81 mg Oral Given 12/27/16 2037)  ezetimibe (ZETIA) tablet 10 mg (10 mg Oral Given 12/27/16 2037)  pantoprazole (PROTONIX) EC tablet 40 mg (40 mg Oral Given 12/27/16 2037)  tamsulosin (FLOMAX) capsule 0.4 mg (0.4 mg Oral Given 12/27/16 2037)  heparin injection 5,000 Units (not administered)  acetaminophen (TYLENOL) tablet 650 mg (not administered)    Or  acetaminophen (TYLENOL) suppository 650 mg (not administered)  ondansetron (ZOFRAN) tablet 4 mg (not administered)    Or  ondansetron (ZOFRAN) injection 4 mg (not administered)  0.9 %  sodium chloride infusion ( Intravenous New Bag/Given 12/27/16 2016)  insulin aspart (novoLOG) injection 0-9 Units (not administered)  sodium chloride 0.9 % bolus 500 mL (500 mLs Intravenous New Bag/Given 12/27/16 1826)  sodium polystyrene (KAYEXALATE) 15 GM/60ML suspension 15 g (15 g Oral Given 12/27/16 2037)     Initial Impression / Assessment and Plan / ED Course  I have reviewed the triage vital signs and the nursing notes.  Pertinent labs & imaging results that were available during my care of the patient were reviewed by me and considered in my medical decision making (see chart for details).     Anastacio Bua is a 71 year old male with history of diabetes, hypertension who presents  to the ED with concern for renal failure by primary care doctor. Patient's vitals at time  of arrival to the ED are significant for hypertension but otherwise are unremarkable and patient is without fever. Patient sent for evaluation he is found to have elevated creatinine by primary care provider. Patient recently started furosemide for leg swelling one month ago. He denies any chest pain, shortness of breath. Patient denies history of heart failure. Exam is unremarkable. Concern for need renal failure. Patient with bilateral leg swelling. Repeat labs show creatinine of 3.54 and elevated potassium of 5.8. Otherwise labs are unremarkable. Patient with EKG that showed sinus rhythm with no signs of ischemic changes or hyperkalemic changes. No need for any temporization of potassium at this time. Patient with hemoglobin of 8.6 with no prior to compare to but patient denies any bloody or melanotic stools. Possibly from renal process. Patient admitted to medicine service for further workup.  Final Clinical Impressions(s) / ED Diagnoses   Final diagnoses:  AKI (acute kidney injury) [facility])  Hyperkalemia    New Prescriptions Current Discharge Medication List       Lennice Sites, DO 12/27/16 [place], [place], MD 12/28/16 [phone]

## 2016-12-28 DIAGNOSIS — N179 Acute kidney failure, unspecified: Secondary | ICD-10-CM | POA: Diagnosis not present

## 2016-12-28 LAB — COMPREHENSIVE METABOLIC PANEL
ALK PHOS: 34 U/L — AB (ref 38–126)
ALT: 11 U/L — ABNORMAL LOW (ref 17–63)
AST: 16 U/L (ref 15–41)
Albumin: 3.1 g/dL — ABNORMAL LOW (ref 3.5–5.0)
Anion gap: 5 (ref 5–15)
BILIRUBIN TOTAL: 0.4 mg/dL (ref 0.3–1.2)
BUN: 44 mg/dL — AB (ref 6–20)
CALCIUM: 8.8 mg/dL — AB (ref 8.9–10.3)
CO2: 22 mmol/L (ref 22–32)
Chloride: 113 mmol/L — ABNORMAL HIGH (ref 101–111)
Creatinine, Ser: 2.89 mg/dL — ABNORMAL HIGH (ref 0.61–1.24)
GFR calc Af Amer: 23 mL/min — ABNORMAL LOW (ref >60)
GFR, EST NON AFRICAN AMERICAN: 20 mL/min — AB (ref >60)
GLUCOSE: 104 mg/dL — AB (ref 65–99)
POTASSIUM: 5.4 mmol/L — AB (ref 3.5–5.1)
Sodium: 140 mmol/L (ref 135–145)
TOTAL PROTEIN: 5.9 g/dL — AB (ref 6.5–8.1)

## 2016-12-28 LAB — GLUCOSE, CAPILLARY
GLUCOSE-CAPILLARY: 103 mg/dL — AB (ref 65–99)
Glucose-Capillary: 147 mg/dL — ABNORMAL HIGH (ref 65–99)
Glucose-Capillary: 93 mg/dL (ref 65–99)

## 2016-12-28 LAB — URINALYSIS, COMPLETE (UACMP) WITH MICROSCOPIC
BACTERIA UA: NONE SEEN
Bilirubin Urine: NEGATIVE
Glucose, UA: NEGATIVE mg/dL
HGB URINE DIPSTICK: NEGATIVE
Ketones, ur: NEGATIVE mg/dL
Leukocytes, UA: NEGATIVE
NITRITE: NEGATIVE
Protein, ur: NEGATIVE mg/dL
RBC / HPF: NONE SEEN RBC/hpf (ref 0–5)
SPECIFIC GRAVITY, URINE: 1.013 (ref 1.005–1.030)
Squamous Epithelial / LPF: NONE SEEN
pH: 5 (ref 5.0–8.0)

## 2016-12-28 LAB — NA AND K (SODIUM & POTASSIUM), RAND UR
Potassium Urine: 38 mmol/L
SODIUM UR: 118 mmol/L

## 2016-12-28 LAB — CBC
HEMATOCRIT: 25.8 % — AB (ref 39.0–52.0)
HEMOGLOBIN: 8.3 g/dL — AB (ref 13.0–17.0)
MCH: 30.1 pg (ref 26.0–34.0)
MCHC: 32.2 g/dL (ref 30.0–36.0)
MCV: 93.5 fL (ref 78.0–100.0)
Platelets: 196 10*3/uL (ref 150–400)
RBC: 2.76 MIL/uL — ABNORMAL LOW (ref 4.22–5.81)
RDW: 15.2 % (ref 11.5–15.5)
WBC: 5.6 10*3/uL (ref 4.0–10.5)

## 2016-12-28 LAB — POTASSIUM
POTASSIUM: 5.2 mmol/L — AB (ref 3.5–5.1)
POTASSIUM: 5.4 mmol/L — AB (ref 3.5–5.1)
POTASSIUM: 5.1 mmol/L (ref 3.5–5.1)
POTASSIUM: 4.7 mmol/L (ref 3.5–5.1)

## 2016-12-28 LAB — OSMOLALITY, URINE: Osmolality, Ur: 531 mOsm/kg (ref 300–900)

## 2016-12-28 LAB — CREATININE, URINE, RANDOM: Creatinine, Urine: 93.21 mg/dL

## 2016-12-28 MED ORDER — SODIUM POLYSTYRENE SULFONATE 15 GM/60ML PO SUSP
15.0000 g | Freq: Once | ORAL | Status: AC
  Administered 2016-12-28: 15 g via ORAL
  Filled 2016-12-28: qty 60

## 2016-12-28 NOTE — Progress Notes (Signed)
[facility] Progress Note  Patient: Cory Ramos VOJ:[id number]   PCP: Vincente Liberty, MD DOB: September 30, 1945   DOA: 12/27/2016   DOS: 12/28/2016   Date of Service: the patient was seen and examined on 12/28/2016  Subjective: Feeling better, patient was told one month ago that his renal function has been worsening. Denies any nausea or vomiting no diarrhea no constipation. Complains about bloating.  Brief hospital course: Pt. with PMH of type II DM, HTN, dyslipidemia, dementia, BPH CKD ; admitted on 12/27/2016, presented with complaint of abnormal lab, was found to have injury with hyperkalemia. Currently further plan is continue IV hydration.  Assessment and Plan: 1. 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 Ultrasound renal unremarkable. Renal function better than the presentation. No evidence of uremia. Continue IV fluids at present. Patient received Kayexalate 2, will continue to monitor potassium. No evidence of acute abnormality. Acute kidney injury likely combination of ACE inhibitor as well as ARB as well as diuretic.  2. Type II DM. Holding oral hypoglycemic agents and placing the patient was sliding scale insulin only.  3. Dementia. Continue home regimen at present.  4. Essential hypertension. Blood pressures significant is stable, I do not that he requires ascending ARB both. We'll discontinue them on discharge. Continue monitoring.  Diet: Cardiac diet DVT Prophylaxis: subcutaneous Heparin  Advance goals of care discussion: full code  Family Communication: no family was present at bedside, at the time of interview.  Disposition:  Discharge to home.  Consultants: none Procedures: none  Antibiotics: Anti-infectives    None       Objective: Physical Exam: Vitals:   12/27/16 2010 12/27/16 2014 12/28/16 0506 12/28/16 1356  BP:  (!) 140/58 (!) 108/45 (!) 107/45  Pulse:  (!)  58 (!) 56 (!) 56  Resp:  18 18 17   Temp:  97.6 F (36.4 C) 97.9 F (36.6 C) 97.6 F (36.4 C)  TempSrc:   Oral   SpO2:  100% 100% 100%  Weight: 106.5 kg (234 lb 11.2 oz)     Height: 6' (1.829 m)       Intake/Output Summary (Last 24 hours) at 12/28/16 1719 Last data filed at 12/28/16 1018  Gross per 24 hour  Intake              960 ml  Output              550 ml  Net              410 ml   Filed Weights   12/27/16 2010  Weight: 106.5 kg (234 lb 11.2 oz)   General: Alert, Awake and Oriented to Time, Place and Person. Appear in mild distress, affect appropriate Eyes: PERRL, Conjunctiva normal ENT: Oral Mucosa clear moist. Neck: no JVD, no Abnormal Mass Or lumps Cardiovascular: S1 and S2 Present, no Murmur, Respiratory: Bilateral Air entry equal and Decreased, no use of accessory muscle, Clear to Auscultation, no Crackles, no wheezes Abdomen: Bowel Sound present, Soft and no tenderness Skin: no redness, no Rash, no induration Extremities: no Pedal edema, no calf tenderness Neurologic: Grossly no focal neuro deficit. Bilaterally Equal motor strength  Data Reviewed: CBC:  Recent Labs Lab 12/27/16 1238 12/28/16 0356  WBC 6.4 5.6  HGB 8.6* 8.3*  HCT 27.4* 25.8*  MCV 93.8 93.5  PLT 215 993   Basic Metabolic Panel:  Recent Labs Lab 12/27/16 1238 12/28/16 0356 12/28/16 0805 12/28/16 1113 12/28/16 1456  NA 140 140  --   --   --   K 5.8* 5.4* 5.2* 5.4* 5.1  CL 113* 113*  --   --   --   CO2 22 22  --   --   --   GLUCOSE 162* 104*  --   --   --   BUN 53* 44*  --   --   --   CREATININE 3.54* 2.89*  --   --   --   CALCIUM 9.2 8.8*  --   --   --     Liver Function Tests:  Recent Labs Lab 12/27/16 1238 12/28/16 0356  AST 20 16  ALT 12* 11*  ALKPHOS 37* 34*  BILITOT 0.5 0.4  PROT 6.4* 5.9*  ALBUMIN 3.6 3.1*    Recent Labs Lab 12/27/16 1238  LIPASE 73*   No results for input(s): AMMONIA in the last 168 hours. Coagulation Profile: No results for  input(s): INR, PROTIME in the last 168 hours. Cardiac Enzymes: No results for input(s): CKTOTAL, CKMB, CKMBINDEX, TROPONINI in the last 168 hours. BNP (last 3 results) No results for input(s): PROBNP in the last 8760 hours. CBG:  Recent Labs Lab 12/27/16 2148 12/28/16 0749 12/28/16 1132  GLUCAP 165* 103* 147*   Studies: US Renal  Result Date: 12/27/2016 CLINICAL DATA:  71 y/o  M; acute kidney injury. EXAM: RENAL / URINARY TRACT ULTRASOUND COMPLETE COMPARISON:  None. FINDINGS: Right Kidney: Length: 10.8 cm. Echogenicity within normal limits. No mass or hydronephrosis visualized. Simple appearing 1.3 cm interpolar cyst. Left Kidney: Length: 11.4 cm. Echogenicity within normal limits. No mass or hydronephrosis visualized. Bladder: Appears normal for degree of bladder distention. IMPRESSION: No acute process identified.  Unremarkable renal ultrasound. Electronically Signed   By: Kristine Garbe M.D.   On: 12/27/2016 20:03    Scheduled Meds: . aspirin EC  81 mg Oral Daily  . ezetimibe  10 mg Oral Daily  . heparin  5,000 Units Subcutaneous Q8H  . insulin aspart  0-9 Units Subcutaneous TID WC  . pantoprazole  40 mg Oral Daily  . tamsulosin  0.4 mg Oral Daily   Continuous Infusions: . sodium chloride 125 mL/hr at 12/28/16 0750   PRN Meds: acetaminophen **OR** acetaminophen, ondansetron **OR** ondansetron (ZOFRAN) IV  Time spent: 30 minutes  Author: Berle Mull, MD [facility] Pager: [phone] 12/28/2016 5:19 PM  If 7PM-7AM, please contact night-coverage at [email or web address], password [facility]

## 2016-12-28 NOTE — Care Management CC44 (Signed)
Condition Code 44 Documentation Completed  Patient Details  Name: Armond Cuthrell MRN: [id number] Date of Birth: 02-09-1946   Condition Code 44 given:  Yes Patient signature on Condition Code 44 notice:  Yes Documentation of 2 MD's agreement:  Yes Code 44 added to claim:  Yes    Erenest Rasher, RN 12/28/2016, 4:40 PM

## 2016-12-28 NOTE — Care Management Obs Status (Signed)
[facility] NOTIFICATION   Patient Details  Name: Nasean Zapf MRN: [id number] Date of Birth: [birth year]   Medicare Observation Status Notification Given:  Yes    Erenest Rasher, RN 12/28/2016, 4:40 PM

## 2016-12-28 NOTE — Care Management Note (Addendum)
Case Management Note  Patient Details  Name: Cory Ramos MRN: [id number] Date of Birth: 1946-01-17  Subjective/Objective:      DM, CKD, HTN              Action/Plan: Discharge Planning: NCM spoke to pt and wife at bedside. Pt states he was instructed to come to hospital from [facility] and they are payor. [place] [phone] fax [phone] to make aware. [place] and left message. [place] PCP, Colen Darling MD. Faxed H&P to Dr Romero Liner [phone], will fax dc summary when available. Attending made aware of ABN. Discussed ABN with pt and wife. [facility] reports [facility].   PCP Vincente Liberty MD  Expected Discharge Date:                  Expected Discharge Plan:  Home/Self Care  In-House Referral:  NA  Discharge planning Services  CM Consult  Post Acute Care Choice:  NA Choice offered to:  NA  DME Arranged:  N/A DME Agency:  NA  HH Arranged:  NA HH Agency:     Status of Service:  In process, will continue to follow  If discussed at Long Length of Stay Meetings, dates discussed:    Additional Comments:  Erenest Rasher, RN 12/28/2016, 4:41 PM

## 2016-12-29 DIAGNOSIS — N179 Acute kidney failure, unspecified: Secondary | ICD-10-CM | POA: Diagnosis not present

## 2016-12-29 LAB — CBC WITH DIFFERENTIAL/PLATELET
BASOS PCT: 1 %
Basophils Absolute: 0 10*3/uL (ref 0.0–0.1)
Eosinophils Absolute: 0.2 10*3/uL (ref 0.0–0.7)
Eosinophils Relative: 3 %
HEMATOCRIT: 25.3 % — AB (ref 39.0–52.0)
Hemoglobin: 8.2 g/dL — ABNORMAL LOW (ref 13.0–17.0)
Lymphocytes Relative: 33 %
Lymphs Abs: 1.9 10*3/uL (ref 0.7–4.0)
MCH: 30.4 pg (ref 26.0–34.0)
MCHC: 32.4 g/dL (ref 30.0–36.0)
MCV: 93.7 fL (ref 78.0–100.0)
MONO ABS: 0.6 10*3/uL (ref 0.1–1.0)
MONOS PCT: 10 %
NEUTROS ABS: 3 10*3/uL (ref 1.7–7.7)
Neutrophils Relative %: 53 %
Platelets: 190 10*3/uL (ref 150–400)
RBC: 2.7 MIL/uL — ABNORMAL LOW (ref 4.22–5.81)
RDW: 15.1 % (ref 11.5–15.5)
WBC: 5.6 10*3/uL (ref 4.0–10.5)

## 2016-12-29 LAB — COMPREHENSIVE METABOLIC PANEL
ALT: 10 U/L — ABNORMAL LOW (ref 17–63)
ANION GAP: 4 — AB (ref 5–15)
AST: 17 U/L (ref 15–41)
Albumin: 3.2 g/dL — ABNORMAL LOW (ref 3.5–5.0)
Alkaline Phosphatase: 30 U/L — ABNORMAL LOW (ref 38–126)
BILIRUBIN TOTAL: 0.6 mg/dL (ref 0.3–1.2)
BUN: 34 mg/dL — ABNORMAL HIGH (ref 6–20)
CO2: 22 mmol/L (ref 22–32)
Calcium: 8.6 mg/dL — ABNORMAL LOW (ref 8.9–10.3)
Chloride: 114 mmol/L — ABNORMAL HIGH (ref 101–111)
Creatinine, Ser: 2.63 mg/dL — ABNORMAL HIGH (ref 0.61–1.24)
GFR calc non Af Amer: 23 mL/min — ABNORMAL LOW (ref >60)
GFR, EST AFRICAN AMERICAN: 26 mL/min — AB (ref >60)
Glucose, Bld: 89 mg/dL (ref 65–99)
POTASSIUM: 4.6 mmol/L (ref 3.5–5.1)
Sodium: 140 mmol/L (ref 135–145)
TOTAL PROTEIN: 5.6 g/dL — AB (ref 6.5–8.1)

## 2016-12-29 LAB — GLUCOSE, CAPILLARY
Glucose-Capillary: 143 mg/dL — ABNORMAL HIGH (ref 65–99)
Glucose-Capillary: 89 mg/dL (ref 65–99)

## 2016-12-29 MED ORDER — SIMETHICONE 80 MG PO CHEW: 80.0000 mg | CHEWABLE_TABLET | Freq: Four times a day (QID) | ORAL | 0 refills | Status: DC | PRN

## 2016-12-29 MED ORDER — SIMETHICONE 80 MG PO CHEW: 80.0000 mg | CHEWABLE_TABLET | Freq: Four times a day (QID) | ORAL | Status: DC | PRN

## 2016-12-29 NOTE — Discharge Instructions (Signed)
Food Basics for Chronic Kidney Disease When your kidneys are not working well, they cannot remove waste and excess substances from your blood as effectively as they did before. This can lead to a buildup and imbalance of these substances, which can worsen kidney damage and affect how your body functions. Certain foods lead to a buildup of these substances in the body. By changing your diet as recommended by your diet and nutrition specialist (dietitian) or health care provider, you could help prevent further kidney damage and delay or prevent the need for dialysis. What are tips for following this plan? General instructions   Work with your health care provider and dietitian to develop a meal plan that is right for you. Foods you can eat, limit, or avoid will be different for each person depending on the stage of kidney disease and any other existing health conditions.  Talk with your health care provider about whether you should take a vitamin and mineral supplement.  Use standard measuring cups and spoons to measure servings of foods. Use a kitchen scale to measure portions of protein foods.  If directed by your health care provider, avoid drinking too much fluid. Measure and count all liquids, including water, ice, soups, flavored gelatin, and frozen desserts such as popsicles or ice cream. Reading food labels   Check the amount of sodium in foods. Choose foods that have less than 300 milligrams (mg) per serving.  Check the ingredient list for phosphorus or potassium-based additives or preservatives.  Check the amount of saturated and trans fat. Limit or avoid these fats as told by your dietitian. Shopping   Avoid buying foods that are:  Processed, frozen, or prepackaged.  Calcium-enriched or fortified.  Do not buy foods that have salt or sodium listed among the first five ingredients.  Do not buy canned vegetables. Cooking   Replace animal proteins, such as meat, fish, eggs, or  dairy, with plant proteins from beans, nuts, and soy.  Use soy milk instead of cow's milk.  Add beans or tofu to soups, casseroles, or pasta dishes instead of meat.  Soak vegetables, such as potatoes, before cooking to reduce potassium. To do this:  Peel and cut into small pieces.  Soak in warm water for at least 2 hours. For every 1 cup of vegetables, use 10 cups of water.  Drain and rinse with warm water.  Boil for at least 5 minutes. Meal planning   Limit the amount of protein from plant and animal sources you eat each day.  Do not add salt to food when cooking or before eating.  Eat meals and snacks at around the same time each day. If you have diabetes:   If you have diabetes (diabetes mellitus) and chronic kidney disease, it is important to keep your blood glucose in the target range recommended by your health care provider. Follow your diabetes management plan. This may include:  Checking your blood glucose regularly.  Taking oral medicines, insulin, or both.  Exercising for at least 30 minutes on 5 or more days each week, or as told by your health care provider.  Tracking how many servings of carbohydrates you eat at each meal.  You may be given specific guidelines on how much of certain foods and nutrients you may eat, depending on your stage of kidney disease and whether you have high blood pressure (hypertension). Follow your meal plan as told by your dietitian. What nutrients should be limited? The items listed are not a complete   list. Talk with your dietitian about what dietary choices are best for you. Potassium  Potassium affects how steadily your heart beats. If too much potassium builds up in your blood, it can cause an irregular heartbeat or even a heart attack. You may need to eat less potassium, depending on your blood potassium levels and the stage of kidney disease. Talk to your dietitian about how much potassium you may have each day. You may need to  limit or avoid foods that are high in potassium, such as:  Milk and soy milk.  Fruits, such as bananas, papaya, apricots, nectarines, melon, prunes, raisins, kiwi, and oranges.  Vegetables, such as potatoes, sweet potatoes, yams, tomatoes, leafy greens, beets, okra, avocado, pumpkin, and winter squash.  White and lima beans. Phosphorus  Phosphorus is a mineral found in your bones. A balance between calcium and phosphorous is needed to build and maintain healthy bones. Too much phosphorus pulls calcium from your bones. This can make your bones weak and more likely to break. Too much phosphorus can also make your skin itch. You may need to eat less phosphorus depending on your blood phosphorus levels and the stage of kidney disease. Talk to your dietitian about how much potassium you may have each day. You may need to take medicine to lower your blood phosphorus levels if diet changes do not help. You may need to limit or avoid foods that are high in phosphorus, such as:  Milk and dairy products.  Dried beans and peas.  Tofu, soy milk, and other soy-based meat replacements.  Colas.  Nuts and peanut butter.  Meat, poultry, and fish.  Bran cereals and oatmeals. Protein  Protein helps you to make and keep muscle. It also helps in the repair of your body's cells and tissues. One of the natural breakdown products of protein is a waste product called urea. When your kidneys are not working properly, they cannot remove wastes, such as urea, like they did before you developed chronic kidney disease. Reducing how much protein you eat can help prevent a buildup of urea in your blood. Depending on your stage of kidney disease, you may need to limit foods that are high in protein. Sources of animal protein include:  Meat (all types).  Fish and seafood.  Poultry.  Eggs.  Dairy. Other protein foods include:  Beans and legumes.  Nuts and nut butter.  Soy and tofu. Sodium  Sodium, which  is found in salt, helps maintain a healthy balance of fluids in your body. Too much sodium can increase your blood pressure and have a negative effect on the function of your heart and lungs. Too much sodium can also cause your body to retain too much fluid, making your kidneys work harder. Most people should have less than 2,300 milligrams (mg) of sodium each day. If you have hypertension, you may need to limit your sodium to 1,500 mg each day. Talk to your dietitian about how much sodium you may have each day. You may need to limit or avoid foods that are high in sodium, such as:  Salt seasonings.  Soy sauce.  Cured and processed meats.  Salted crackers and snack foods.  Fast food.  Canned soups and most canned foods.  Pickled foods.  Vegetable juice.  Boxed mixes or ready-to-eat boxed meals and side dishes.  Bottled dressings, sauces, and marinades. Summary  more kidney damage and delay or prevent the need for dialysis.  Food adjustments are different for each person with   chronic kidney disease. Work with a dietitian to set up nutrient goals and a meal plan that is right for you.  If you have diabetes and chronic kidney disease, it is important to keep your blood glucose in the target range recommended by your health care provider. This information is not intended to replace advice given to you by your health care provider. Make sure you discuss any questions you have with your health care provider. Document Released: 10/15/2002 Document Revised: 07/20/2016 Document Reviewed: 07/20/2016 Elsevier Interactive Patient Education  2017 Elsevier Inc.  

## 2016-12-29 NOTE — Progress Notes (Signed)
Responded to [facility] Consult to assist pt. With AD.  Patient said he did not ask for it but wanted to know about it.  I went over document with patient and gave him a form to take home.  Instructions were given on how to proceed. Patient sitting in chair preparing to leave.  Pt. Being discharged.

## 2016-12-29 NOTE — Progress Notes (Signed)
Cory Ramos to be D/C'd Home per MD order.  Discussed with the patient and all questions fully answered.  VSS, Skin clean, dry and intact without evidence of skin break down, no evidence of skin tears noted. IV catheter discontinued intact. Site without signs and symptoms of complications. Dressing and pressure applied.  An After Visit Summary was printed and given to the patient. Patient received prescription.  D/c education completed with patient/family including follow up instructions, medication list, d/c activities limitations if indicated, with other d/c instructions as indicated by MD - patient able to verbalize understanding, all questions fully answered.   Patient instructed to return to ED, call 911, or call MD for any changes in condition.   Patient escorted via [place], and D/C home via private auto. Allergies as of 12/29/2016   No Known Allergies     Medication List    STOP taking these medications   amiloride-hydrochlorothiazide 5-50 MG tablet Commonly known as:  MODURETIC   amLODipine-olmesartan 5-20 MG tablet Commonly known as:  AZOR   benazepril 40 MG tablet Commonly known as:  LOTENSIN   furosemide 40 MG tablet Commonly known as:  LASIX   gabapentin 300 MG capsule Commonly known as:  NEURONTIN   glipiZIDE-metformin 5-500 MG tablet Commonly known as:  METAGLIP   potassium chloride SA 20 MEQ tablet Commonly known as:  K-DUR,KLOR-CON   TRILIPIX 135 MG capsule Generic drug:  Choline Fenofibrate     TAKE these medications   aspirin EC 81 MG tablet Take 81 mg by mouth daily.   colesevelam 625 MG tablet Commonly known as:  WELCHOL Take 1,250 mg by mouth 2 (two) times daily with a meal.   donepezil 5 MG tablet Commonly known as:  ARICEPT Take 5 mg by mouth at bedtime.   ezetimibe 10 MG tablet Commonly known as:  ZETIA Take 10 mg by mouth daily.   memantine 10 MG tablet Commonly known as:  NAMENDA Take 10 mg by mouth at bedtime.   omeprazole 20 MG  capsule Commonly known as:  PRILOSEC Take 20 mg by mouth 2 (two) times daily before a meal.   pioglitazone 30 MG tablet Commonly known as:  ACTOS Take 30 mg by mouth daily.   ranitidine 150 MG tablet Commonly known as:  ZANTAC Take 1 tablet (150 mg total) by mouth at bedtime.   simethicone 80 MG chewable tablet Commonly known as:  MYLICON Chew 1 tablet (80 mg total) by mouth every 6 (six) hours as needed for flatulence.   tamsulosin 0.4 MG Caps capsule Commonly known as:  FLOMAX Take 0.4 mg by mouth at bedtime.   Vitamin D (Ergocalciferol) 50000 units Caps capsule Commonly known as:  DRISDOL Take 50,000 Units by mouth every 7 (seven) days.      Cory Ramos Cory Ramos 12/29/2016 12:37 PM

## 2017-01-03 NOTE — Discharge Summary (Signed)
[facility] Discharge Summary   Patient: Cory Ramos IZT:[id number]   PCP: Vincente Liberty, MD DOB: 1946-02-08   Date of admission: 12/27/2016   Date of discharge: 12/29/2016    Discharge Diagnoses:  Principal Problem:   AKI (acute kidney injury) ([place]) Active Problems:   DM (diabetes mellitus), type 2, uncontrolled, with renal complications ([place])   Hyperkalemia   GERD (gastroesophageal reflux disease)   Essential hypertension   Anemia   Admitted From: home Disposition:  Home with family  Recommendations for Outpatient Follow-up:  1. Please follow up with PCP as recommended   Follow-up Information    Vincente Liberty, MD. Schedule an appointment as soon as possible for a visit in 1 week(s).   Specialty:  Pulmonary Disease Why:  get a referral to renal doctors. Contact information: [place] [address] [phone]          Diet recommendation: renal diet  Activity: The patient is advised to gradually reintroduce usual activities.  Discharge Condition: good  Code Status: full code  History of present illness: As per the H and P dictated on admission, "Cory Ramos is a 71 y.o. male with medical history significant of DM, CKD unknown baseline, HTN, dyslipidemia, Mild Dementia, BPH was sent to the ER by his MD from [facility]. patient is a very poor historian who reports that he had labs done by his PCP through the [place] [facility] about 1-2 weeks ago, he reports getting a call from his PCP this morning asking him to come to the nurse emergency room for further workup of kidney disease. His wife is at bedside who reports that she is at work during the daytime and hence does not know if they called him last week and if he just doesn't recall the instructions then because of his dementia. Patient denies any NSAID use, no history of nausea vomiting or diarrhea no history of recent hospitalizations. He reports knowing that he's had some  kidney disease for a couple years."  Hospital Course:  Summary of his active problems in the hospital is as following. 1. 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 Ultrasound renal unremarkable. Renal function better than the presentation. No evidence of uremia. Given IV fluids in hospital  Patient received Kayexalate 2, with improvement in K, recommendation was provided for low potasium diet at home Acute kidney injury likely combination of ACE inhibitor as well as ARB as well as diuretic.  2. Type II DM. Continue PIos only  Stop metformin and hold glipizide.  3. Dementia. Continue home regimen at present.  4. Essential hypertension. Blood pressures is stable, without any antihypertensives  I do not that he requires ACE-I and ARB both. We'll discontinue them on discharge. Continue monitoring outpatient.   All other chronic medical condition were stable during the hospitalization.  Patient was ambulatory without any assistance. On the day of the discharge the patient's vitals were stable , and no other acute medical condition were reported by patient. the patient was felt safe to be discharge at home with family.  Procedures and Results:  none   Consultations:  none  DISCHARGE MEDICATION: Discharge Medication List as of 12/29/2016 11:00 AM    CONTINUE these medications which have NOT CHANGED   Details  aspirin EC 81 MG tablet Take 81 mg by mouth daily., Historical Med    colesevelam (WELCHOL) 625 MG tablet Take 1,250 mg by mouth 2 (two) times daily  with a meal. , Historical Med    donepezil (ARICEPT) 5 MG tablet Take 5 mg by mouth at bedtime., Historical Med    ezetimibe (ZETIA) 10 MG tablet Take 10 mg by mouth daily., Historical Med    memantine (NAMENDA) 10 MG tablet Take 10 mg by mouth at bedtime., Historical Med    omeprazole (PRILOSEC) 20 MG capsule Take 20 mg by mouth 2 (two)  times daily before a meal., Historical Med    pioglitazone (ACTOS) 30 MG tablet Take 30 mg by mouth daily., Historical Med    ranitidine (ZANTAC) 150 MG tablet Take 1 tablet (150 mg total) by mouth at bedtime., Starting Mon 07/25/2016, Until Tue 12/27/2016, Print    tamsulosin (FLOMAX) 0.4 MG CAPS capsule Take 0.4 mg by mouth at bedtime. , Historical Med    Vitamin D, Ergocalciferol, (DRISDOL) 50000 units CAPS capsule Take 50,000 Units by mouth every 7 (seven) days., Historical Med      STOP taking these medications     amiloride-hydrochlorothiazide (MODURETIC) 5-50 MG tablet      amLODipine-olmesartan (AZOR) 5-20 MG tablet      benazepril (LOTENSIN) 40 MG tablet      Choline Fenofibrate (TRILIPIX) 135 MG capsule      furosemide (LASIX) 40 MG tablet      gabapentin (NEURONTIN) 300 MG capsule      glipiZIDE-metformin (METAGLIP) 5-500 MG tablet      potassium chloride SA (K-DUR,KLOR-CON) 20 MEQ tablet        No Known Allergies Discharge Instructions    Diet renal [id number]    Complete by:  As directed    Discharge instructions    Complete by:  As directed    It is important that you read following instructions as well as go over your medication list with RN to help you understand your care after this hospitalization.  Discharge Instructions: Please follow-up with PCP in one week, get referral to renal doctors.  Please request your primary care physician to go over all Hospital Tests and Procedure/Radiological results at the follow up,  Please get all Hospital records sent to your PCP by signing hospital release before you go home.   Do not take more than prescribed Pain, Sleep and Anxiety Medications. You were cared for by a hospitalist during your hospital stay. If you have any questions about your discharge medications or the care you received while you were in the hospital after you are discharged, you can call the unit and ask to speak with the hospitalist on call  if the hospitalist that took care of you is not available.  Once you are discharged, your primary care physician will handle any further medical issues. Please note that NO REFILLS for any discharge medications will be authorized once you are discharged, as it is imperative that you return to your primary care physician (or establish a relationship with a primary care physician if you do not have one) for your aftercare needs so that they can reassess your need for medications and monitor your lab values. You Must read complete instructions/literature along with all the possible adverse reactions/side effects for all the Medicines you take and that have been prescribed to you. Take any new Medicines after you have completely understood and accept all the possible adverse reactions/side effects. Wear Seat belts while driving. If you have smoked or chewed Tobacco in the last 2 yrs please stop smoking and/or stop any Recreational drug use.   Increase activity slowly  Complete by:  As directed      Discharge Exam: Filed Weights   12/27/16 2010  Weight: 106.5 kg (234 lb 11.2 oz)   Vitals:   12/28/16 2217 12/29/16 0515  BP: (!) 119/47 133/61  Pulse: (!) 58 60  Resp: 18 18  Temp: 98.6 F (37 C) 98.8 F (37.1 C)   General: Appear in no distress, no Rash; Oral Mucosa moist. Cardiovascular: S1 and S2 Present, no Murmur, no JVD Respiratory: Bilateral Air entry present and Clear to Auscultation, no Crackles, no wheezes Abdomen: Bowel Sound present, Soft and no tenderness Extremities: no Pedal edema, no calf tenderness Neurology: Grossly no focal neuro deficit.  The results of significant diagnostics from this hospitalization (including imaging, microbiology, ancillary and laboratory) are listed below for reference.    Significant Diagnostic Studies: US Renal  Result Date: 12/27/2016 CLINICAL DATA:  71 y/o  M; acute kidney injury. EXAM: RENAL / URINARY TRACT ULTRASOUND COMPLETE COMPARISON:   None. FINDINGS: Right Kidney: Length: 10.8 cm. Echogenicity within normal limits. No mass or hydronephrosis visualized. Simple appearing 1.3 cm interpolar cyst. Left Kidney: Length: 11.4 cm. Echogenicity within normal limits. No mass or hydronephrosis visualized. Bladder: Appears normal for degree of bladder distention. IMPRESSION: No acute process identified.  Unremarkable renal ultrasound. Electronically Signed   By: Kristine Garbe M.D.   On: 12/27/2016 20:03    Microbiology: No results found for this or any previous visit (from the past 240 hour(s)).   Labs: CBC:  Recent Labs Lab 12/27/16 1238 12/28/16 0356 12/29/16 0444  WBC 6.4 5.6 5.6  NEUTROABS  --   --  3.0  HGB 8.6* 8.3* 8.2*  HCT 27.4* 25.8* 25.3*  MCV 93.8 93.5 93.7  PLT 215 196 322   Basic Metabolic Panel:  Recent Labs Lab 12/27/16 1238 12/28/16 0356 12/28/16 0805 12/28/16 1113 12/28/16 1456 12/28/16 1935 12/29/16 0444  NA 140 140  --   --   --   --  140  K 5.8* 5.4* 5.2* 5.4* 5.1 4.7 4.6  CL 113* 113*  --   --   --   --  114*  CO2 22 22  --   --   --   --  22  GLUCOSE 162* 104*  --   --   --   --  89  BUN 53* 44*  --   --   --   --  34*  CREATININE 3.54* 2.89*  --   --   --   --  2.63*  CALCIUM 9.2 8.8*  --   --   --   --  8.6*   Liver Function Tests:  Recent Labs Lab 12/27/16 1238 12/28/16 0356 12/29/16 0444  AST 20 16 17   ALT 12* 11* 10*  ALKPHOS 37* 34* 30*  BILITOT 0.5 0.4 0.6  PROT 6.4* 5.9* 5.6*  ALBUMIN 3.6 3.1* 3.2*    Recent Labs Lab 12/27/16 1238  LIPASE 73*   No results for input(s): AMMONIA in the last 168 hours. Cardiac Enzymes: No results for input(s): CKTOTAL, CKMB, CKMBINDEX, TROPONINI in the last 168 hours. BNP (last 3 results) No results for input(s): BNP in the last 8760 hours. CBG:  Recent Labs Lab 12/28/16 0749 12/28/16 1132 12/28/16 1742 12/28/16 2218 12/29/16 0747  GLUCAP 103* 147* 93 143* 89   Time spent: 35 minutes  Signed:  PATEL,  PRANAV  [facility] 12/29/2016 , 7:10 AM

## 2017-04-20 ENCOUNTER — Encounter (HOSPITAL_COMMUNITY): Payer: Self-pay | Admitting: Emergency Medicine

## 2017-04-20 ENCOUNTER — Ambulatory Visit (HOSPITAL_COMMUNITY)
Admission: EM | Admit: 2017-04-20 | Discharge: 2017-04-20 | Disposition: A | Discharge status: Home / Self Care | Payer: Medicare Other | Attending: Family Medicine | Admitting: Family Medicine

## 2017-04-20 DIAGNOSIS — K219 Gastro-esophageal reflux disease without esophagitis: Secondary | ICD-10-CM

## 2017-04-20 MED ORDER — RANITIDINE HCL 300 MG PO TABS: 300.0000 mg | ORAL_TABLET | Freq: Every day | ORAL | 2 refills | Status: DC

## 2017-04-20 MED ORDER — OMEPRAZOLE 40 MG PO CPDR: 40.0000 mg | DELAYED_RELEASE_CAPSULE | Freq: Every day | ORAL | 0 refills | Status: DC

## 2017-04-20 NOTE — ED Triage Notes (Signed)
Pt states he has had chest pain for the last two years associated with GERD.  Pt takes Zantac.  He states he started having a "biting" pain in his mid chest, to the left that radiates to his left shoulder and arm.  He states it is the same kind of pain as he gets with GERD, but this time it was troublesome to him.

## 2017-04-20 NOTE — ED Provider Notes (Addendum)
[facility]   [id number] 04/20/17 Arrival Time: 1545   SUBJECTIVE:  Cory Ramos is a 71 y.o. male who presents to the urgent care chest pain. He states he has past history of GERD ongoing for 2 years, states his symptoms are consistent with prior episodes, he reports "splurging" on his diet last night eating fried chicken, collards, and hot sauce.  Chest Pain: Patient complains of chest pain. Onset was 12 hours ago, with improving course since that time. The patient describes the pain as constant, sharp in nature, does not radiate. Patient rates pain as a 5/10 in intensity.  Associated symptoms are chest pressure/discomfort. Aggravating factors are food.  Alleviating factors are: position change and holding pressure on his chest. Patient's cardiac risk factors are advanced age (older than 39 for men, 19 for women), diabetes mellitus, hypertension and male gender.  Patient's risk factors for DVT/PE: none. Previous cardiac testing: electrocardiogram (ECG).    Past Medical History:  Diagnosis Date  . Acute kidney injury ([place]) 12/27/2016  . Anemia 12/27/2016  . Dementia   . Diabetes mellitus without complication ([place])   . GERD (gastroesophageal reflux disease)   . Hyperlipidemia   . Hypertension   . Kidney disease 12/27/2016   BUN 53 Creat 3.54   Family History  Problem Relation Age of Onset  . Kidney disease Sister    Social History   Social History  . Marital status: Single    Spouse name: N/A  . Number of children: N/A  . Years of education: N/A   Occupational History  . Not on file.   Social History Main Topics  . Smoking status: Never Smoker  . Smokeless tobacco: Never Used  . Alcohol use No  . Drug use: No  . Sexual activity: Not on file   Other Topics Concern  . Not on file   Social History Narrative  . No narrative on file   Current Meds  Medication Sig  . aspirin EC 81 MG tablet Take 81 mg by mouth daily.  . colesevelam (WELCHOL) 625 MG tablet  Take 1,250 mg by mouth 2 (two) times daily with a meal.   . donepezil (ARICEPT) 5 MG tablet Take 5 mg by mouth at bedtime.  Marland Kitchen ezetimibe (ZETIA) 10 MG tablet Take 10 mg by mouth daily.  . memantine (NAMENDA) 10 MG tablet Take 10 mg by mouth at bedtime.  . pioglitazone (ACTOS) 30 MG tablet Take 30 mg by mouth daily.  . simethicone (MYLICON) 80 MG chewable tablet Chew 1 tablet (80 mg total) by mouth every 6 (six) hours as needed for flatulence.  . tamsulosin (FLOMAX) 0.4 MG CAPS capsule Take 0.4 mg by mouth at bedtime.   . Vitamin D, Ergocalciferol, (DRISDOL) 50000 units CAPS capsule Take 50,000 Units by mouth every 7 (seven) days.  . [DISCONTINUED] omeprazole (PRILOSEC) 20 MG capsule Take 20 mg by mouth 2 (two) times daily before a meal.   No Known Allergies    ROS: As per HPI, remainder of ROS negative.   OBJECTIVE:   Vitals:   04/20/17 1616  BP: (!) 177/71  Pulse: 71  Temp: 97.8 F (36.6 C)  TempSrc: Oral  SpO2: 100%     General appearance: alert; no distress Eyes: PERRL; EOMI; conjunctiva normal HENT: normocephalic; atraumatic; TMs normal, canal normal, external ears normal without trauma; nasal mucosa normal; oral mucosa normal Neck: supple, no bruits, no JVD noted Lungs: clear to auscultation bilaterally Heart: regular rate and rhythm, No peripheral edema  Abdomen: soft, non-tender; bowel sounds normal; no masses or organomegaly; no guarding or rebound tenderness Extremities: no cyanosis or edema; symmetrical with no gross deformities Skin: warm and dry Neurologic: normal gait; grossly normal Psychological: alert and cooperative; normal mood and affect      Labs:  Labs Reviewed - No data to display  No results found.     ASSESSMENT & PLAN:  1. Gastroesophageal reflux disease, esophagitis presence not specified     Meds ordered this encounter  Medications  . ranitidine (ZANTAC) 300 MG tablet    Sig: Take 1 tablet (300 mg total) by mouth at bedtime.     Dispense:  30 tablet    Refill:  2  . omeprazole (PRILOSEC) 40 MG capsule    Sig: Take 1 capsule (40 mg total) by mouth daily.    Dispense:  30 capsule    Refill:  0   Provided strict follow-up guidelines, recommend going to the emergency room, or worsening symptoms, otherwise follow-up with primary care for further evaluation and management of acid reflux.  Reviewed expectations re: course of current medical issues. Questions answered. Outlined signs and symptoms indicating need for more acute intervention. Patient verbalized understanding. After Visit Summary given.    Procedures:    Twelve-lead EKG shows a normal sinus rhythm with sinus arrhythmia, ventricular rate 62 bpm, PR interval 154 ms, QRS duration 86 ms, there is a normal axis, no ST segment elevation, no ectopy noted. No changes from previous EKG taken in May 2018   Barnet Glasgow, NP 04/20/17 1702

## 2017-04-20 NOTE — Discharge Instructions (Signed)
I have increased the dose of your Zantac, take one tablet every night before bedtime. I've also increased your Prilosec dose is well, take one tablet daily. If you experience chest pain while walking, climbing stairs, or doing other strenuous activities, have shortness of breath, sweating, or other concerning symptoms, to to the emergency room, otherwise, follow up with your primary care provider as needed for further evaluation of acid reflux

## 2017-05-03 ENCOUNTER — Encounter (HOSPITAL_COMMUNITY): Payer: Self-pay | Admitting: Emergency Medicine

## 2017-05-03 ENCOUNTER — Emergency Department (HOSPITAL_COMMUNITY)
Admission: EM | Admit: 2017-05-03 | Discharge: 2017-05-04 | Disposition: A | Discharge status: Home / Self Care | Payer: Medicare Other | Attending: Emergency Medicine | Admitting: Emergency Medicine

## 2017-05-03 DIAGNOSIS — Z7982 Long term (current) use of aspirin: Secondary | ICD-10-CM | POA: Insufficient documentation

## 2017-05-03 DIAGNOSIS — K921 Melena: Secondary | ICD-10-CM | POA: Insufficient documentation

## 2017-05-03 DIAGNOSIS — G309 Alzheimer's disease, unspecified: Secondary | ICD-10-CM | POA: Diagnosis not present

## 2017-05-03 DIAGNOSIS — E119 Type 2 diabetes mellitus without complications: Secondary | ICD-10-CM | POA: Diagnosis not present

## 2017-05-03 DIAGNOSIS — I10 Essential (primary) hypertension: Secondary | ICD-10-CM | POA: Insufficient documentation

## 2017-05-03 DIAGNOSIS — Z79899 Other long term (current) drug therapy: Secondary | ICD-10-CM | POA: Diagnosis not present

## 2017-05-03 HISTORY — DX: Dementia in other diseases classified elsewhere, unspecified severity, without behavioral disturbance, psychotic disturbance, mood disturbance, and anxiety: F02.80

## 2017-05-03 HISTORY — DX: Alzheimer's disease, unspecified: G30.9

## 2017-05-03 LAB — COMPREHENSIVE METABOLIC PANEL
ALT: 13 U/L — AB (ref 17–63)
AST: 23 U/L (ref 15–41)
Albumin: 3.4 g/dL — ABNORMAL LOW (ref 3.5–5.0)
Alkaline Phosphatase: 80 U/L (ref 38–126)
Anion gap: 8 (ref 5–15)
BILIRUBIN TOTAL: 0.6 mg/dL (ref 0.3–1.2)
BUN: 24 mg/dL — AB (ref 6–20)
CHLORIDE: 104 mmol/L (ref 101–111)
CO2: 27 mmol/L (ref 22–32)
CREATININE: 1.84 mg/dL — AB (ref 0.61–1.24)
Calcium: 8.9 mg/dL (ref 8.9–10.3)
GFR calc Af Amer: 40 mL/min — ABNORMAL LOW (ref >60)
GFR, EST NON AFRICAN AMERICAN: 35 mL/min — AB (ref >60)
Glucose, Bld: 172 mg/dL — ABNORMAL HIGH (ref 65–99)
Potassium: 3.7 mmol/L (ref 3.5–5.1)
Sodium: 139 mmol/L (ref 135–145)
Total Protein: 6.4 g/dL — ABNORMAL LOW (ref 6.5–8.1)

## 2017-05-03 LAB — CBC WITH DIFFERENTIAL/PLATELET
BASOS ABS: 0 10*3/uL (ref 0.0–0.1)
Basophils Relative: 0 %
Eosinophils Absolute: 0.1 10*3/uL (ref 0.0–0.7)
Eosinophils Relative: 2 %
HCT: 33.6 % — ABNORMAL LOW (ref 39.0–52.0)
HEMOGLOBIN: 10.8 g/dL — AB (ref 13.0–17.0)
LYMPHS PCT: 21 %
Lymphs Abs: 1.4 10*3/uL (ref 0.7–4.0)
MCH: 29.1 pg (ref 26.0–34.0)
MCHC: 32.1 g/dL (ref 30.0–36.0)
MCV: 90.6 fL (ref 78.0–100.0)
Monocytes Absolute: 0.6 10*3/uL (ref 0.1–1.0)
Monocytes Relative: 9 %
NEUTROS ABS: 4.5 10*3/uL (ref 1.7–7.7)
Neutrophils Relative %: 68 %
PLATELETS: 160 10*3/uL (ref 150–400)
RBC: 3.71 MIL/uL — ABNORMAL LOW (ref 4.22–5.81)
RDW: 13.6 % (ref 11.5–15.5)
WBC: 6.6 10*3/uL (ref 4.0–10.5)

## 2017-05-03 LAB — POC OCCULT BLOOD, ED: FECAL OCCULT BLD: POSITIVE — AB

## 2017-05-03 LAB — PROTIME-INR
INR: 1.08
Prothrombin Time: 13.9 seconds (ref 11.4–15.2)

## 2017-05-03 NOTE — ED Notes (Signed)
Pt Occult card Positive

## 2017-05-03 NOTE — ED Triage Notes (Signed)
Patient reports bloody stools onset yesterday , denies emesis or diarrhea , no abdominal pain or fever .

## 2017-05-04 LAB — CBC
HEMATOCRIT: 35 % — AB (ref 39.0–52.0)
Hemoglobin: 11.4 g/dL — ABNORMAL LOW (ref 13.0–17.0)
MCH: 29.2 pg (ref 26.0–34.0)
MCHC: 32.6 g/dL (ref 30.0–36.0)
MCV: 89.5 fL (ref 78.0–100.0)
Platelets: 153 10*3/uL (ref 150–400)
RBC: 3.91 MIL/uL — ABNORMAL LOW (ref 4.22–5.81)
RDW: 13.7 % (ref 11.5–15.5)
WBC: 8 10*3/uL (ref 4.0–10.5)

## 2017-05-04 NOTE — ED Provider Notes (Signed)
[facility] DEPT Provider Note   CSN: [id number] Arrival date & time: 05/03/17  1705     History   Chief Complaint Chief Complaint  Patient presents with  . Hematochezia    HPI Timtohy Broski is a 71 y.o. male.  The history is provided by the spouse and the patient.  Rectal Bleeding  Quality:  Bright red Amount:  Moderate Timing:  Intermittent Chronicity:  New Context: constipation   Context: not diarrhea   Relieved by:  None tried Worsened by:  Nothing Associated symptoms: no abdominal pain, no dizziness, no fever, no hematemesis and no vomiting   Risk factors comment:  ASA use pt reports a bowel movement yesterday that was blood and stool He had another episode prior to arrival that similar No black stool No vomiting It is blood in water and stool noted No abd pain No weakness He has increased ASA use recently for shoulder pain No recent h/o GI bleed He reports recent straining with BM Past Medical History:  Diagnosis Date  . Acute kidney injury ([place]) 12/27/2016  . Alzheimer's disease   . Anemia 12/27/2016  . Dementia   . Diabetes mellitus without complication ([place])   . GERD (gastroesophageal reflux disease)   . Hyperlipidemia   . Hypertension   . Kidney disease 12/27/2016   BUN 53 Creat 3.54    Patient Active Problem List   Diagnosis Date Noted  . DM (diabetes mellitus), type 2, uncontrolled, with renal complications ([place]) 78/29/5621  . AKI (acute kidney injury) ([place]) 12/27/2016  . Hyperkalemia 12/27/2016  . GERD (gastroesophageal reflux disease) 12/27/2016  . Essential hypertension 12/27/2016  . Anemia 12/27/2016    History reviewed. No pertinent surgical history.     Home Medications    Prior to Admission medications   Medication Sig Start Date End Date Taking? Authorizing Provider  amLODipine-olmesartan (AZOR) 5-20 MG tablet Take 1 tablet by mouth daily.   Yes [provider]  aspirin EC 81 MG tablet Take 81 mg by mouth daily.    Yes [provider]  colesevelam (WELCHOL) 625 MG tablet Take 1,250 mg by mouth 2 (two) times daily with a meal.    Yes [provider]  donepezil (ARICEPT) 5 MG tablet Take 5 mg by mouth at bedtime.   Yes [provider]  ezetimibe (ZETIA) 10 MG tablet Take 10 mg by mouth daily.   Yes [provider]  furosemide (LASIX) 40 MG tablet Take 20 mg by mouth 2 (two) times daily.    Yes [provider]  memantine (NAMENDA) 10 MG tablet Take 10 mg by mouth at bedtime.   Yes [provider]  omeprazole (PRILOSEC) 40 MG capsule Take 1 capsule (40 mg total) by mouth daily. 04/20/17  Yes Barnet Glasgow, NP  pioglitazone (ACTOS) 30 MG tablet Take 30 mg by mouth daily.   Yes [provider]  polyvinyl alcohol (LUBRICANT DROPS) 1.4 % ophthalmic solution Place 1 drop into both eyes daily as needed for dry eyes.   Yes [provider]  ranitidine (ZANTAC) 300 MG tablet Take 1 tablet (300 mg total) by mouth at bedtime. 04/20/17  Yes Barnet Glasgow, NP  simethicone (MYLICON) 80 MG chewable tablet Chew 1 tablet (80 mg total) by mouth every 6 (six) hours as needed for flatulence. 12/29/16  Yes Lavina Hamman, MD  tamsulosin (FLOMAX) 0.4 MG CAPS capsule Take 0.4 mg by mouth every evening.   Yes [provider]  Vitamin D, Ergocalciferol, (DRISDOL) 50000  units CAPS capsule Take 50,000 Units by mouth every 7 (seven) days.   Yes [provider]    Family History Family History  Problem Relation Age of Onset  . Kidney disease Sister     Social History Social History  Substance Use Topics  . Smoking status: Never Smoker  . Smokeless tobacco: Never Used  . Alcohol use No     Allergies   Patient has no known allergies.   Review of Systems Review of Systems  Constitutional: Negative for fever.  Gastrointestinal: Positive for blood in stool and hematochezia. Negative for abdominal pain, hematemesis and vomiting.    Neurological: Negative for dizziness.  All other systems reviewed and are negative.    Physical Exam Updated Vital Signs BP (!) 169/73   Pulse (!) 59   Temp 97.8 F (36.6 C) (Oral)   Resp 17   Ht 1.829 m (6')   Wt 103 kg (227 lb)   SpO2 100%   BMI 30.79 kg/m   Physical Exam  CONSTITUTIONAL: Well developed/well nourished HEAD: Normocephalic/atraumatic EYES: EOMI/PERRL, conjunctiva pink ENMT: Mucous membranes moist NECK: supple no meningeal signs SPINE/BACK:entire spine nontender CV: S1/S2 noted, no murmurs/rubs/gallops noted LUNGS: Lungs are clear to auscultation bilaterally, no apparent distress ABDOMEN: soft, nontender, no rebound or guarding, bowel sounds noted throughout abdomen Rectal - no blood or melena noted, chaperone present GU:no cva tenderness NEURO: Pt is awake/alert/appropriate, moves all extremitiesx4.  No facial droop.   EXTREMITIES: pulses normal/equal, full ROM SKIN: warm, color normal PSYCH: no abnormalities of mood noted, alert and oriented to situation  ED Treatments / Results  Labs (all labs ordered are listed, but only abnormal results are displayed) Labs Reviewed  CBC WITH DIFFERENTIAL/PLATELET - Abnormal; Notable for the following:       Result Value   RBC 3.71 (*)    Hemoglobin 10.8 (*)    HCT 33.6 (*)    All other components within normal limits  COMPREHENSIVE METABOLIC PANEL - Abnormal; Notable for the following:    Glucose, Bld 172 (*)    BUN 24 (*)    Creatinine, Ser 1.84 (*)    Total Protein 6.4 (*)    Albumin 3.4 (*)    ALT 13 (*)    GFR calc non Af Amer 35 (*)    GFR calc Af Amer 40 (*)    All other components within normal limits  CBC - Abnormal; Notable for the following:    RBC 3.91 (*)    Hemoglobin 11.4 (*)    HCT 35.0 (*)    All other components within normal limits  POC OCCULT BLOOD, ED - Abnormal; Notable for the following:    Fecal Occult Bld POSITIVE (*)    All other components within normal limits   PROTIME-INR    EKG  EKG Interpretation None       Radiology No results found.  Procedures Procedures (including critical care time)  Medications Ordered in ED Medications - No data to display   Initial Impression / Assessment and Plan / ED Course  I have reviewed the triage vital signs and the nursing notes.  Pertinent labs   results that were available during my care of the patient were reviewed by me and considered in my medical decision making (see chart for details).     Pt well appearing No acute drop in HGB Stool was not bloody or black Suspect hemoccult + from recent bleed Given h/o straining, suspect he may have small tear  from hard stool I doubt acute Upper/lower GI bleed Cut back on ASA use F/u with GI specialist  Final Clinical Impressions(s) / ED Diagnoses   Final diagnoses:  Hematochezia    New Prescriptions New Prescriptions   No medications on file     Ripley Fraise, MD 05/04/17 0119

## 2017-08-14 ENCOUNTER — Encounter: Payer: Self-pay | Admitting: Radiation Oncology

## 2017-08-14 ENCOUNTER — Ambulatory Visit
Admission: RE | Admit: 2017-08-14 | Discharge: 2017-08-14 | Disposition: A | Discharge status: Home / Self Care | Payer: Medicare Other | Source: Ambulatory Visit | Attending: Radiation Oncology | Admitting: Radiation Oncology

## 2017-08-14 ENCOUNTER — Encounter: Payer: Self-pay | Admitting: Medical Oncology

## 2017-08-14 ENCOUNTER — Other Ambulatory Visit: Payer: Self-pay

## 2017-08-14 VITALS — BP 94/44 | HR 44 | Temp 97.7°F | Resp 18 | Ht 72.0 in | Wt 238.4 lb

## 2017-08-14 DIAGNOSIS — K219 Gastro-esophageal reflux disease without esophagitis: Secondary | ICD-10-CM | POA: Insufficient documentation

## 2017-08-14 DIAGNOSIS — Z79899 Other long term (current) drug therapy: Secondary | ICD-10-CM | POA: Diagnosis not present

## 2017-08-14 DIAGNOSIS — Z7984 Long term (current) use of oral hypoglycemic drugs: Secondary | ICD-10-CM | POA: Insufficient documentation

## 2017-08-14 DIAGNOSIS — F0280 Dementia in other diseases classified elsewhere without behavioral disturbance: Secondary | ICD-10-CM | POA: Insufficient documentation

## 2017-08-14 DIAGNOSIS — I10 Essential (primary) hypertension: Secondary | ICD-10-CM | POA: Insufficient documentation

## 2017-08-14 DIAGNOSIS — E119 Type 2 diabetes mellitus without complications: Secondary | ICD-10-CM | POA: Diagnosis not present

## 2017-08-14 DIAGNOSIS — C61 Malignant neoplasm of prostate: Secondary | ICD-10-CM

## 2017-08-14 DIAGNOSIS — G309 Alzheimer's disease, unspecified: Secondary | ICD-10-CM | POA: Diagnosis not present

## 2017-08-14 DIAGNOSIS — E785 Hyperlipidemia, unspecified: Secondary | ICD-10-CM | POA: Insufficient documentation

## 2017-08-14 DIAGNOSIS — Z7982 Long term (current) use of aspirin: Secondary | ICD-10-CM | POA: Insufficient documentation

## 2017-08-14 HISTORY — DX: Malignant neoplasm of prostate: C61

## 2017-08-14 NOTE — Progress Notes (Addendum)
GU Location of Tumor / Histology: prostatic adenocarcinoma  If Prostate Cancer, Gleason Score is (3 + 3) and PSA is (6.95). Prostate volume:62.4 grams.  Cory Ramos reports Dr. Alyson Ingles has been watching his PSA for two years now.   Biopsies of prostate (if applicable) revealed:    Past/Anticipated interventions by urology, if any: monitoring PSA x 2 years, biopsy, referral to Dr. Tammi Klippel for consideration of brachytherapy  Past/Anticipated interventions by medical oncology, if any: no  Weight changes, if any: Yes lost 12 lb when medication for memory was prescribed but has since gained all this weight back quickly.  Bowel/Bladder complaints, if any: IPSS 4. Denies dysuria, hematuria, leakage or incontinence.   Nausea/Vomiting, if any: no  Pain issues, if any:  No other than occasional leg cramps.  SAFETY ISSUES:  Prior radiation? no  Pacemaker/ICD? no  Possible current pregnancy? no  Is the patient on methotrexate? no  Current Complaints / other details:  72 year old male. Married with 2 girls. 1 daughter is local. Reports that he has five grandchildren. Resides in [place]. Retired Art gallery manager.

## 2017-08-14 NOTE — Progress Notes (Signed)
Introduced myself to Cory Ramos and his wife as the nurse navigator and my role. He has been under active surveillance for 2 years and now referred for treatment. He would like brachytherapy if he is a candidate. I will continue to follow and asked them to call me with questions and or concerns.

## 2017-08-14 NOTE — Progress Notes (Signed)
See progress note under physician encounter. 

## 2017-08-14 NOTE — Progress Notes (Signed)
Radiation Oncology         [phone] ________________________________  Initial Outpatient Consultation  Name: Cory Ramos MRN: [id number]  Date: 08/14/2017  DOB: March 08, 1946  IO:NGEX, Harrell Gave, MD  McKenzie, Candee Furbish, MD   REFERRING PHYSICIAN: Cleon Gustin, MD  DIAGNOSIS: 72 y.o. 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 Malignant neoplasm of prostate ([place]) C61     HISTORY OF PRESENT ILLNESS: Cory Ramos is a 72 y.o. male with a diagnosis of prostate cancer. He was initially noted to have an elevated PSA of 7.5 in 2016 by his primary care physician, Dr. Vincente Liberty, and was referred for evaluation in urology to Dr. Nicolette Bang. At that time the patient proceeded to active surveillance with routine PSA testing. His PSA remained stable around 4-5 until October 2018 when it increased to 6.95. The patient proceeded to transrectal ultrasound with 12 biopsies of the prostate on 07/13/2017. A digital rectal examination was performed at that time revealing no prostate nodules. The prostate volume measured 62.4 cc.  Out of 12 core biopsies, 3 were positive.  The maximum Gleason score was 3+3, and this was seen in the right base, right mid, and right mid lateral.  Biopsy of prostate revealed:    PSA History: 05/2017 PSA 6.95 02/2017 PSA 6.61 08/2016 PSA 4.24 02/2016 PSA 4.37 08/2015 PSA 6.40 02/2015 PSA 5.47 09/2014 PSA 7.50 09/2013 PSA 4.75  The patient reviewed the biopsy results with his urologist and he has kindly been referred today for discussion of potential radiation treatment options. He is accompanied by his wife.   PREVIOUS RADIATION THERAPY: No  PAST MEDICAL HISTORY:  Past Medical History:  Diagnosis Date  . Acute kidney injury ([place]) 12/27/2016  . Alzheimer's disease   . Anemia 12/27/2016  . Dementia   . Diabetes mellitus without complication ([place])   . GERD  (gastroesophageal reflux disease)   . Hyperlipidemia   . Hypertension   . Kidney disease 12/27/2016   BUN 53 Creat 3.54  . Prostate cancer ([place])       PAST SURGICAL HISTORY: Past Surgical History:  Procedure Laterality Date  . PROSTATE BIOPSY      FAMILY HISTORY:  Family History  Problem Relation Age of Onset  . Kidney disease Sister   . Cancer Neg Hx     SOCIAL HISTORY:  Social History   Socioeconomic History  . Marital status: Single    Spouse name: Not on file  . Number of children: Not on file  . Years of education: Not on file  . Highest education level: Not on file  Social Needs  . Financial resource strain: Not on file  . Food insecurity - worry: Not on file  . Food insecurity - inability: Not on file  . Transportation needs - medical: Not on file  . Transportation needs - non-medical: Not on file  Occupational History  . Occupation: retired  Tobacco Use  . Smoking status: Never Smoker  . Smokeless tobacco: Never Used  Substance and Sexual Activity  . Alcohol use: No  . Drug use: No  . Sexual activity: No  Other Topics Concern  . Not on file  Social History Narrative   Married.    Lives in [place].   Has 2 adult children. 5 grandchildren.   Retired Art gallery manager.    ALLERGIES: Patient has no known allergies.  MEDICATIONS:  Current  Outpatient Medications  Medication Sig Dispense Refill  . amLODipine-olmesartan (AZOR) 5-20 MG tablet Take 1 tablet by mouth daily.    Marland Kitchen aspirin EC 81 MG tablet Take 81 mg by mouth daily.    . colesevelam (WELCHOL) 625 MG tablet Take 1,250 mg by mouth 2 (two) times daily with a meal.     . donepezil (ARICEPT) 5 MG tablet Take 5 mg by mouth at bedtime.    Marland Kitchen ezetimibe (ZETIA) 10 MG tablet Take 10 mg by mouth daily.    . furosemide (LASIX) 40 MG tablet Take 20 mg by mouth 2 (two) times daily.     Marland Kitchen GLIPIZIDE ER PO Take 5 mg by mouth daily.    . memantine (NAMENDA) 10 MG tablet Take 10 mg by mouth at bedtime.      Marland Kitchen omeprazole (PRILOSEC) 40 MG capsule Take 1 capsule (40 mg total) by mouth daily. 30 capsule 0  . pioglitazone (ACTOS) 30 MG tablet Take 30 mg by mouth daily.    . polyvinyl alcohol (LUBRICANT DROPS) 1.4 % ophthalmic solution Place 1 drop into both eyes daily as needed for dry eyes.    . ranitidine (ZANTAC) 300 MG tablet Take 1 tablet (300 mg total) by mouth at bedtime. 30 tablet 2  . simethicone (MYLICON) 80 MG chewable tablet Chew 1 tablet (80 mg total) by mouth every 6 (six) hours as needed for flatulence. 30 tablet 0  . tamsulosin (FLOMAX) 0.4 MG CAPS capsule Take 0.4 mg by mouth every evening.    Marland Kitchen tiZANidine (ZANAFLEX) 4 MG tablet Take 4 mg by mouth every 6 (six) hours as needed for muscle spasms.    . Vitamin D, Ergocalciferol, (DRISDOL) 50000 units CAPS capsule Take 50,000 Units by mouth every 7 (seven) days.     No current facility-administered medications for this encounter.     REVIEW OF SYSTEMS:  On review of systems, the patient reports that he is doing well overall. He reports a 12-pound weight loss when medication for memory was prescribed but has since quickly gained this weight back. He denies any chest pain, shortness of breath, cough, fevers, chills, or night sweats. He denies any bowel disturbances, and denies abdominal pain, nausea or vomiting. He reports occasional leg cramps. His IPSS was 4, indicating mild urinary symptoms of intermittency and nocturia x3. He denies dysuria, hematuria, leakage or incontinence. He is not sexually active. A complete review of systems is obtained and is otherwise negative.    PHYSICAL EXAM:  Wt Readings from Last 3 Encounters:  08/14/17 238 lb 6.4 oz (108.1 kg)  05/03/17 227 lb (103 kg)  12/27/16 234 lb 11.2 oz (106.5 kg)   Temp Readings from Last 3 Encounters:  08/14/17 97.7 F (36.5 C) (Oral)  05/03/17 97.8 F (36.6 C) (Oral)  04/20/17 97.8 F (36.6 C) (Oral)   BP Readings from Last 3 Encounters:  08/14/17 (!) 94/44  05/04/17  (!) 169/73  04/20/17 (!) 177/71   Pulse Readings from Last 3 Encounters:  08/14/17 (!) 44  05/04/17 (!) 59  04/20/17 71   Pain Assessment Pain Score: 0-No pain/10  In general this is a well appearing African-American man in no acute distress. He is alert and oriented x4 and appropriate throughout the examination. HEENT reveals that the patient is normocephalic, atraumatic. Skin is intact without any evidence of gross lesions. Cardiovascular exam reveals a regular rate and rhythm, no clicks rubs or murmurs are auscultated. Chest is clear to auscultation bilaterally. Lymphatic assessment is  performed and does not reveal any adenopathy in the cervical, supraclavicular, axillary, or inguinal chains. Abdomen has active bowel sounds in all quadrants and is intact. The abdomen is soft, non tender, non distended.    KPS = 100  100 - Normal; no complaints; no evidence of disease. 90   - Able to carry on normal activity; minor signs or symptoms of disease. 80   - Normal activity with effort; some signs or symptoms of disease. 52   - Cares for self; unable to carry on normal activity or to do active work. 60   - Requires occasional assistance, but is able to care for most of his personal needs. 50   - Requires considerable assistance and frequent medical care. 44   - Disabled; requires special care and assistance. 65   - Severely disabled; hospital admission is indicated although death not imminent. 67   - Very sick; hospital admission necessary; active supportive treatment necessary. 10   - Moribund; fatal processes progressing rapidly. 0     - Dead  Karnofsky DA, Abelmann [place], Craver LS and Burchenal [facility] [phone]) The use of the nitrogen mustards in the palliative treatment of carcinoma: with particular reference to bronchogenic carcinoma Cancer 1 634-56  LABORATORY DATA:  Lab Results  Component Value Date   WBC 8.0 05/03/2017   HGB 11.4 (L) 05/03/2017   HCT 35.0 (L) 05/03/2017   MCV 89.5  05/03/2017   PLT 153 05/03/2017   Lab Results  Component Value Date   NA 139 05/03/2017   K 3.7 05/03/2017   CL 104 05/03/2017   CO2 27 05/03/2017   Lab Results  Component Value Date   ALT 13 (L) 05/03/2017   AST 23 05/03/2017   ALKPHOS 80 05/03/2017   BILITOT 0.6 05/03/2017     RADIOGRAPHY: No results found.    IMPRESSION/PLAN: 1. 72 y.o. 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 Dr. Tammi Klippel discussed the patient's workup and outlines the nature of prostate cancer in this setting. The patient's T stage, Gleason's score, and PSA put him into the low risk group. Accordingly, he is eligible for a variety of potential treatment options including active surveillance, brachytherapy, or 8 weeks of external radiation. We discussed the available radiation techniques, and focused on the details and logistics and delivery. We discussed and outlined the risks, benefits, short and long-term effects associated with radiotherapy and compared and contrasted these with other treatment options. We also discussed that if he were to choose active surveillance, we would require an MRI-guided biopsy at 12-18 month intervals. At the end of the conversation the patient is interested in prostate brachytherapy but would first like to receive a second opinion from the [facility]. He will call us with his decision. We will send our notes and we also detailed the role for SpaceOAR gel placement at the time of his seed implant, if he proceeds with this as his form of treatment.     Carola Rhine, PAC And  Sheral Apley Tammi Klippel, M.D.  This document serves as a record of services personally performed by Tyler Pita, MD and Shona Simpson, PA-C. It was created on their behalf by Rae Lips, a trained medical scribe. The creation of this record is based on the scribe's personal observations and the providers' statements to them. This document has  been checked and approved by the attending providers.

## 2017-08-15 DIAGNOSIS — C61 Malignant neoplasm of prostate: Secondary | ICD-10-CM | POA: Insufficient documentation

## 2017-08-31 ENCOUNTER — Telehealth: Payer: Self-pay | Admitting: Radiation Oncology

## 2017-08-31 NOTE — Telephone Encounter (Signed)
I spoke with the patient's wife, they have not decided on treatment yet for his prostate cancer and are going for second opinion at the [facility] on 09/12/2017.  They will call us back after this visit, and I let her know that I would call back if I had not heard from them by the end of the month.

## 2017-09-26 ENCOUNTER — Telehealth: Payer: Self-pay | Admitting: Radiation Oncology

## 2017-09-26 NOTE — Telephone Encounter (Signed)
LM for the patient to call me back to discuss treatment options.

## 2017-10-04 ENCOUNTER — Telehealth: Payer: Self-pay | Admitting: Radiation Oncology

## 2017-10-04 NOTE — Telephone Encounter (Addendum)
The patient has chosen to receive care at the [facility] and is having repeat PSA and rebiopsy under MRI guidance rather than treatment at this time. I encouraged them to call back if they had questions or concerns about his course.

## 2019-12-08 ENCOUNTER — Encounter (HOSPITAL_COMMUNITY): Payer: Self-pay

## 2019-12-08 ENCOUNTER — Ambulatory Visit (HOSPITAL_COMMUNITY)
Admission: EM | Admit: 2019-12-08 | Discharge: 2019-12-08 | Disposition: A | Discharge status: Home / Self Care | Payer: Medicare Other | Attending: Family Medicine | Admitting: Family Medicine

## 2019-12-08 ENCOUNTER — Ambulatory Visit (INDEPENDENT_AMBULATORY_CARE_PROVIDER_SITE_OTHER): Payer: Medicare Other

## 2019-12-08 ENCOUNTER — Other Ambulatory Visit: Payer: Self-pay

## 2019-12-08 DIAGNOSIS — S52592A Other fractures of lower end of left radius, initial encounter for closed fracture: Secondary | ICD-10-CM

## 2019-12-08 DIAGNOSIS — W4904XA Ring or other jewelry causing external constriction, initial encounter: Secondary | ICD-10-CM

## 2019-12-08 DIAGNOSIS — S52692A Other fracture of lower end of left ulna, initial encounter for closed fracture: Secondary | ICD-10-CM

## 2019-12-08 DIAGNOSIS — M25532 Pain in left wrist: Secondary | ICD-10-CM

## 2019-12-08 DIAGNOSIS — M25432 Effusion, left wrist: Secondary | ICD-10-CM

## 2019-12-08 DIAGNOSIS — M7989 Other specified soft tissue disorders: Secondary | ICD-10-CM | POA: Diagnosis not present

## 2019-12-08 DIAGNOSIS — W19XXXA Unspecified fall, initial encounter: Secondary | ICD-10-CM

## 2019-12-08 MED ORDER — TRAMADOL HCL 50 MG PO TABS: 50.0000 mg | ORAL_TABLET | Freq: Four times a day (QID) | ORAL | 0 refills | Status: DC | PRN

## 2019-12-08 MED ORDER — METHOCARBAMOL 500 MG PO TABS: 500.0000 mg | ORAL_TABLET | Freq: Two times a day (BID) | ORAL | 0 refills | Status: DC

## 2019-12-08 NOTE — ED Triage Notes (Signed)
Pt presents with left wrist injury after a fall yesterday.  Pt has significant hand, wrist and arm swelling with a wrist deformity.

## 2019-12-08 NOTE — Discharge Instructions (Addendum)
Take the ibuprofen as prescribed.  Rest and elevate your hand.  Apply ice packs 2-3 times a day for up to 20 minutes each.  Wear the Ace wrap as needed for comfort.    Follow up with your primary care provider or an orthopedist if you symptoms continue or worsen;  Or if you develop new symptoms, such as numbness, tingling, or weakness.    YOU HAVE AN APPT WITH DR [facility] Monday Dec 09, 2019 at 8:30am

## 2019-12-08 NOTE — Progress Notes (Signed)
Orthopedic Tech Progress Note Patient Details:  Cory Ramos June 10, 1946 [id number]  Ortho Devices Type of Ortho Device: Arm sling, Sugartong splint Ortho Device/Splint Location: LUE Ortho Device/Splint Interventions: Ordered, Application   Post Interventions Patient Tolerated: Well Instructions Provided: Care of [place] 12/08/2019, 5:08 PM

## 2019-12-08 NOTE — ED Provider Notes (Signed)
[place]    CSN: [id number] Arrival date & time: 12/08/19  1503      History   Chief Complaint Chief Complaint  Patient presents with  . Fall  . Wrist Pain    Left    HPI Cory Ramos is a 74 y.o. male.   Reports that he fell yesterday today to self on the driveway.  Reports that he tripped over a hedge clippers that he was using yesterday.  States that when he fell he put both of his arms out to catch himself.  Reports that he has had left wrist pain and hand swelling since the incident.  His wife reports that he tried to to wrap his wrist with duct tape.  Has not taken any medications for this at home.  Reports that his pain is concentrated over the radial side of his left wrist.  Denies hearing any pops or cracks when he fell.  Denies change in sensation of his hands or fingers, denies pain radiating up the arm, denies headache, cough, shortness of breath, nausea, vomiting, diarrhea, rash, fever, other symptoms.  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  ROS per HPI  The history is provided by the patient.    Past Medical History:  Diagnosis Date  . Acute kidney injury ([place]) 12/27/2016  . Alzheimer's disease ([place])   . Anemia 12/27/2016  . Dementia ([place])   . Diabetes mellitus without complication ([place])   . GERD (gastroesophageal reflux disease)   . Hyperlipidemia   . Hypertension   . Kidney disease 12/27/2016   BUN 53 Creat 3.54  . Prostate cancer [facility])     Patient Active Problem List   Diagnosis Date Noted  . Malignant neoplasm of prostate ([place]) 08/15/2017  . DM (diabetes mellitus), type 2, uncontrolled, with renal complications ([place]) 99991111  . AKI (acute kidney injury) ([place]) 12/27/2016  . Hyperkalemia 12/27/2016  . GERD (gastroesophageal reflux disease) 12/27/2016  . Essential hypertension 12/27/2016  . Anemia 12/27/2016    Past Surgical  History:  Procedure Laterality Date  . PROSTATE BIOPSY         Home Medications    Prior to Admission medications   Medication Sig Start Date End Date Taking? Authorizing Provider  amLODipine-olmesartan (AZOR) 5-20 MG tablet Take 1 tablet by mouth daily.    [provider]  aspirin EC 81 MG tablet Take 81 mg by mouth daily.    [provider]  colesevelam (WELCHOL) 625 MG tablet Take 1,250 mg by mouth 2 (two) times daily with a meal.     [provider]  donepezil (ARICEPT) 5 MG tablet Take 5 mg by mouth at bedtime.    [provider]  ezetimibe (ZETIA) 10 MG tablet Take 10 mg by mouth daily.    [provider]  furosemide (LASIX) 40 MG tablet Take 20 mg by mouth 2 (two) times daily.     [provider]  GLIPIZIDE ER PO Take 5 mg by mouth daily.    [provider]  memantine (NAMENDA) 10 MG tablet Take 10 mg by mouth at bedtime.    [provider]  methocarbamol (ROBAXIN) 500 MG tablet Take 1 tablet (500 mg total) by mouth 2 (two) times daily. 12/08/19   Faustino Congress, NP  omeprazole (PRILOSEC) 40 MG capsule Take 1 capsule (40 mg total) by mouth daily. 04/20/17   Barnet Glasgow, NP  pioglitazone (ACTOS)  30 MG tablet Take 30 mg by mouth daily.    [provider]  polyvinyl alcohol (LUBRICANT DROPS) 1.4 % ophthalmic solution Place 1 drop into both eyes daily as needed for dry eyes.    [provider]  ranitidine (ZANTAC) 300 MG tablet Take 1 tablet (300 mg total) by mouth at bedtime. 04/20/17   Barnet Glasgow, NP  simethicone (MYLICON) 80 MG chewable tablet Chew 1 tablet (80 mg total) by mouth every 6 (six) hours as needed for flatulence. 12/29/16   Lavina Hamman, MD  tamsulosin (FLOMAX) 0.4 MG CAPS capsule Take 0.4 mg by mouth every evening.    [provider]  tiZANidine (ZANAFLEX) 4 MG tablet Take 4 mg by mouth every 6 (six) hours as needed for muscle spasms.    [provider]  traMADol (ULTRAM) 50 MG tablet Take 1 tablet (50 mg total) by mouth every 6 (six) hours as needed. 12/08/19   Faustino Congress, NP  Vitamin D, Ergocalciferol, (DRISDOL) 50000 units CAPS capsule Take 50,000 Units by mouth every 7 (seven) days.    [provider]    Family History Family History  Problem Relation Age of Onset  . Kidney disease Sister   . Cancer Neg Hx     Social History Social History   Tobacco Use  . Smoking status: Never Smoker  . Smokeless tobacco: Never Used  Substance Use Topics  . Alcohol use: No  . Drug use: No     Allergies   Patient has no known allergies.   Review of Systems Review of Systems   Physical Exam Triage Vital Signs ED Triage Vitals  Enc Vitals Group     BP      Pulse      Resp      Temp      Temp src      SpO2      Weight      Height      Head Circumference      Peak Flow      Pain Score      Pain Loc      Pain Edu?      Excl. in [place]?    No data found.  Updated Vital Signs BP (!) 197/78 (BP Location: Right Arm)   Pulse 69   Temp 98.7 F (37.1 C) (Oral)   Resp 17   SpO2 95%   Visual Acuity Right Eye Distance:   Left Eye Distance:   Bilateral Distance:    Right Eye Near:   Left Eye Near:    Bilateral Near:     Physical Exam Vitals and nursing note reviewed.  Constitutional:      General: He is not in acute distress.    Appearance: Normal appearance. He is well-developed and normal weight. He is not ill-appearing.  HENT:     Head: Normocephalic and atraumatic.  Eyes:     Conjunctiva/sclera: Conjunctivae normal.  Cardiovascular:     Rate and Rhythm: Normal rate and regular rhythm.     Heart sounds: Normal heart sounds. No murmur.  Pulmonary:     Effort: Pulmonary effort is normal. No respiratory distress.     Breath sounds: Normal breath sounds. No stridor. No wheezing, rhonchi or rales.  Chest:     Chest wall: No tenderness.  Abdominal:     General: Bowel sounds are  normal.     Palpations: Abdomen is soft.     Tenderness: There is no  abdominal tenderness.  Musculoskeletal:        General: Swelling, tenderness, deformity and signs of injury present.     Cervical back: Normal range of motion and neck supple.     Comments: Moderate swelling to the left hand, there is also significant tenderness and deformity noted to the left wrist.  There is some extensive swelling up to the left forearm as well.  Skin:    General: Skin is warm and dry.     Capillary Refill: Capillary refill takes less than 2 seconds.  Neurological:     General: No focal deficit present.     Mental Status: He is alert and oriented to person, place, and time.  Psychiatric:        Mood and Affect: Mood normal.        Behavior: Behavior normal.        Thought Content: Thought content normal.      UC Treatments / Results  Labs (all labs ordered are listed, but only abnormal results are displayed) Labs Reviewed - No data to display  EKG   Radiology DG Wrist Complete Left  Result Date: 12/08/2019 CLINICAL DATA:  LEFT wrist pain and swelling post fall 1 day ago EXAM: LEFT WRIST - COMPLETE 3+ VIEW COMPARISON:  None. FINDINGS: Osseous demineralization. Joint spaces preserved. Nondisplaced ulnar styloid fracture. Comminuted fracture distal LEFT radial metaphysis extending intra-articular at radiocarpal joint. Neutral tilt of distal radial articular surface. No additional fracture or dislocation. Diffuse regional soft tissue swelling. IMPRESSION: Nondisplaced LEFT ulnar styloid fracture. Comminuted minimally displaced intra-articular fracture of the distal LEFT radial metaphysis with neutral tilt of distal radial articular surface. Electronically Signed   By: Lavonia Dana M.D.   On: 12/08/2019 16:26    Procedures Procedures (including critical care time)  Medications Ordered in UC Medications - No data to display  Initial Impression / Assessment and Plan / UC Course  I have reviewed  the triage vital signs and the nursing notes.  Pertinent labs & imaging results that were available during my care of the patient were reviewed by me and considered in my medical decision making (see chart for details).     Fall Left wrist pain Swelling of L hand Closed Distal Ulnar Fracture Closed Distal Radial Fracture: Presents with left wrist pain after mechanical fall yesterday.  Has attempted to wrap the wrist at home with little relief.  Has not taken any medication for this.  On exam, patient is alert, no acute distress.  Lungs CTA bilaterally, heart sounds normal S1-S2.  There is obvious deformity at the left wrist and significant swelling of the left hand.  The patient's wedding ring is still in place, cannot be removed without mechanical assistance.  X-ray of the left wrist shows "Nondisplaced LEFT ulnar styloid fracture. Comminuted minimally displaced intra-articular fracture of the distal LEFT radial metaphysis with neutral tilt of distal radial articular surface."  Spoke with Dr. Mardelle Matte who is on-call for orthopedics today for consult on whether this needed to be reduced.  Per Dr. Mardelle Matte we will apply splint, and he will follow-up with him in his office at 8:30 in the morning.  Orthotec applied sugar tong splint, sling applied.  Also removed the patient's wedding ring ring with a ring cutter today.  Ring was placed in specimen cup and given to the patient's wife.  Prescribed tramadol 50 mg 1 tablet every 6 hours as needed for moderate to severe pain.  Also prescribed Robaxin 500 mg twice daily  as needed muscle spasms.  Discussed with patient and wife how to check for capillary refill and circulation of the left hand.  Instructed patient to elevate that arm so that the swelling may be decreased.  Patient instructed to follow-up with the ER if he loses sensation in his fingers, is having very slow capillary refill, is having sudden change in pain, severe pain.  Wife and patient verbalized  understanding, agreed with treatment plan.  Final Clinical Impressions(s) / UC Diagnoses   Final diagnoses:  Left wrist pain  Fall, initial encounter  Swelling of left hand  Other closed fracture of distal end of left ulna, initial encounter  Other closed fracture of distal end of left radius, initial encounter  Ring or other jewelry causing external constriction, initial encounter     Discharge Instructions     Take the ibuprofen as prescribed.  Rest and elevate your hand.  Apply ice packs 2-3 times a day for up to 20 minutes each.  Wear the Ace wrap as needed for comfort.    Follow up with your primary care provider or an orthopedist if you symptoms continue or worsen;  Or if you develop new symptoms, such as numbness, tingling, or weakness.    YOU HAVE AN APPT WITH DR [facility] Monday Dec 09, 2019 at 8:30am    ED Prescriptions    Medication Sig Dispense Auth. Provider   traMADol (ULTRAM) 50 MG tablet Take 1 tablet (50 mg total) by mouth every 6 (six) hours as needed. 15 tablet Faustino Congress, NP   methocarbamol (ROBAXIN) 500 MG tablet Take 1 tablet (500 mg total) by mouth 2 (two) times daily. 20 tablet Faustino Congress, NP     I have reviewed the PDMP during this encounter.   Faustino Congress, NP 12/09/19 [phone]

## 2020-08-16 ENCOUNTER — Other Ambulatory Visit: Payer: Self-pay

## 2020-08-16 ENCOUNTER — Emergency Department (HOSPITAL_COMMUNITY): Payer: Medicare Other

## 2020-08-16 ENCOUNTER — Observation Stay (HOSPITAL_COMMUNITY)
Admission: EM | Admit: 2020-08-16 | Discharge: 2020-08-17 | Disposition: A | Discharge status: Home / Self Care | Payer: Medicare Other | Attending: Family Medicine | Admitting: Family Medicine

## 2020-08-16 ENCOUNTER — Encounter (HOSPITAL_COMMUNITY): Payer: Self-pay | Admitting: Emergency Medicine

## 2020-08-16 DIAGNOSIS — Z7984 Long term (current) use of oral hypoglycemic drugs: Secondary | ICD-10-CM | POA: Insufficient documentation

## 2020-08-16 DIAGNOSIS — R55 Syncope and collapse: Secondary | ICD-10-CM | POA: Diagnosis present

## 2020-08-16 DIAGNOSIS — Z8546 Personal history of malignant neoplasm of prostate: Secondary | ICD-10-CM | POA: Diagnosis not present

## 2020-08-16 DIAGNOSIS — E1122 Type 2 diabetes mellitus with diabetic chronic kidney disease: Secondary | ICD-10-CM | POA: Diagnosis not present

## 2020-08-16 DIAGNOSIS — U071 COVID-19: Secondary | ICD-10-CM | POA: Diagnosis not present

## 2020-08-16 DIAGNOSIS — F0280 Dementia in other diseases classified elsewhere without behavioral disturbance: Secondary | ICD-10-CM | POA: Insufficient documentation

## 2020-08-16 DIAGNOSIS — I129 Hypertensive chronic kidney disease with stage 1 through stage 4 chronic kidney disease, or unspecified chronic kidney disease: Secondary | ICD-10-CM | POA: Insufficient documentation

## 2020-08-16 DIAGNOSIS — N189 Chronic kidney disease, unspecified: Secondary | ICD-10-CM | POA: Insufficient documentation

## 2020-08-16 DIAGNOSIS — Z79899 Other long term (current) drug therapy: Secondary | ICD-10-CM | POA: Diagnosis not present

## 2020-08-16 DIAGNOSIS — Z7982 Long term (current) use of aspirin: Secondary | ICD-10-CM | POA: Diagnosis not present

## 2020-08-16 DIAGNOSIS — R2689 Other abnormalities of gait and mobility: Secondary | ICD-10-CM | POA: Insufficient documentation

## 2020-08-16 DIAGNOSIS — G308 Other Alzheimer's disease: Secondary | ICD-10-CM | POA: Diagnosis not present

## 2020-08-16 LAB — TSH: TSH: 1.038 u[IU]/mL (ref 0.350–4.500)

## 2020-08-16 LAB — BASIC METABOLIC PANEL
Anion gap: 11 (ref 5–15)
BUN: 29 mg/dL — ABNORMAL HIGH (ref 8–23)
CO2: 26 mmol/L (ref 22–32)
Calcium: 8.4 mg/dL — ABNORMAL LOW (ref 8.9–10.3)
Chloride: 106 mmol/L (ref 98–111)
Creatinine, Ser: 2.5 mg/dL — ABNORMAL HIGH (ref 0.61–1.24)
GFR, Estimated: 26 mL/min — ABNORMAL LOW (ref >60)
Glucose, Bld: 140 mg/dL — ABNORMAL HIGH (ref 70–99)
Potassium: 4 mmol/L (ref 3.5–5.1)
Sodium: 143 mmol/L (ref 135–145)

## 2020-08-16 LAB — CBC
HCT: 36.1 % — ABNORMAL LOW (ref 39.0–52.0)
Hemoglobin: 11.5 g/dL — ABNORMAL LOW (ref 13.0–17.0)
MCH: 31.3 pg (ref 26.0–34.0)
MCHC: 31.9 g/dL (ref 30.0–36.0)
MCV: 98.4 fL (ref 80.0–100.0)
Platelets: 101 10*3/uL — ABNORMAL LOW (ref 150–400)
RBC: 3.67 MIL/uL — ABNORMAL LOW (ref 4.22–5.81)
RDW: 13.1 % (ref 11.5–15.5)
WBC: 5.8 10*3/uL (ref 4.0–10.5)
nRBC: 0 % (ref 0.0–0.2)

## 2020-08-16 LAB — D-DIMER, QUANTITATIVE: D-Dimer, Quant: 0.63 ug/mL-FEU — ABNORMAL HIGH (ref 0.00–0.50)

## 2020-08-16 LAB — RESP PANEL BY RT-PCR (FLU A&B, COVID) ARPGX2
Influenza A by PCR: NEGATIVE
Influenza B by PCR: NEGATIVE
SARS Coronavirus 2 by RT PCR: POSITIVE — AB

## 2020-08-16 LAB — MAGNESIUM: Magnesium: 2 mg/dL (ref 1.7–2.4)

## 2020-08-16 LAB — TROPONIN I (HIGH SENSITIVITY)
Troponin I (High Sensitivity): 12 ng/L (ref <18)
Troponin I (High Sensitivity): 14 ng/L (ref <18)

## 2020-08-16 MED ORDER — COLESEVELAM HCL 625 MG PO TABS
1250.0000 mg | ORAL_TABLET | Freq: Two times a day (BID) | ORAL | Status: DC
  Administered 2020-08-17 (×2): 1250 mg via ORAL
  Filled 2020-08-16 (×3): qty 2

## 2020-08-16 MED ORDER — DICLOFENAC SODIUM 75 MG PO TBEC
75.0000 mg | DELAYED_RELEASE_TABLET | Freq: Two times a day (BID) | ORAL | Status: DC | PRN
Start: 2020-08-16 — End: 2020-08-17

## 2020-08-16 MED ORDER — EZETIMIBE 10 MG PO TABS
10.0000 mg | ORAL_TABLET | Freq: Every morning | ORAL | Status: DC
  Administered 2020-08-17: 10 mg via ORAL
  Filled 2020-08-16: qty 1

## 2020-08-16 MED ORDER — SODIUM CHLORIDE 0.9 % IV BOLUS
500.0000 mL | Freq: Once | INTRAVENOUS | Status: AC
  Administered 2020-08-17: 500 mL via INTRAVENOUS

## 2020-08-16 MED ORDER — ACETAMINOPHEN 325 MG PO TABS
650.0000 mg | ORAL_TABLET | Freq: Four times a day (QID) | ORAL | Status: DC | PRN
  Administered 2020-08-17: 650 mg via ORAL
  Filled 2020-08-16: qty 2

## 2020-08-16 MED ORDER — ASPIRIN EC 81 MG PO TBEC
81.0000 mg | DELAYED_RELEASE_TABLET | Freq: Every morning | ORAL | Status: DC
  Administered 2020-08-17: 81 mg via ORAL
  Filled 2020-08-16: qty 1

## 2020-08-16 MED ORDER — PROPYLENE GLYCOL 0.6 % OP SOLN: 1.0000 [drp] | Freq: Four times a day (QID) | OPHTHALMIC | Status: DC

## 2020-08-16 MED ORDER — DONEPEZIL HCL 5 MG PO TABS
20.0000 mg | ORAL_TABLET | Freq: Every day | ORAL | Status: DC
  Administered 2020-08-17: 20 mg via ORAL
  Filled 2020-08-16: qty 4

## 2020-08-16 MED ORDER — TAMSULOSIN HCL 0.4 MG PO CAPS
0.4000 mg | ORAL_CAPSULE | Freq: Every day | ORAL | Status: DC
  Administered 2020-08-17: 0.4 mg via ORAL
  Filled 2020-08-16: qty 1

## 2020-08-16 MED ORDER — INSULIN ASPART 100 UNIT/ML ~~LOC~~ SOLN: 0.0000 [IU] | Freq: Three times a day (TID) | SUBCUTANEOUS | Status: DC

## 2020-08-16 MED ORDER — POLYVINYL ALCOHOL 1.4 % OP SOLN
1.0000 [drp] | Freq: Every day | OPHTHALMIC | Status: DC
  Filled 2020-08-16: qty 15

## 2020-08-16 MED ORDER — MEMANTINE HCL 10 MG PO TABS
20.0000 mg | ORAL_TABLET | Freq: Every day | ORAL | Status: DC
  Administered 2020-08-17: 20 mg via ORAL
  Filled 2020-08-16: qty 2

## 2020-08-16 MED ORDER — ENOXAPARIN SODIUM 30 MG/0.3ML ~~LOC~~ SOLN
30.0000 mg | SUBCUTANEOUS | Status: DC
  Administered 2020-08-17: 30 mg via SUBCUTANEOUS
  Filled 2020-08-16: qty 0.3

## 2020-08-16 NOTE — ED Provider Notes (Signed)
[place] EMERGENCY DEPARTMENT Provider Note   CSN: [id number] Arrival date & time: 08/16/20  1304     History Chief Complaint  Patient presents with  . syncopal episode    Cory Ramos is a 75 y.o. male history of Alzheimer's dementia, diabetes, GERD, hypertension, hyperlipidemia, CKD.  Patient arrives via EMS today after syncopal episode after eating lunch.  Patient ANO x4 on my evaluation he reports that he was eating some cheese, felt lightheaded while sitting at the kitchen counter, he does not remember losing consciousness.  He reports his wife told him shortly afterward that he "passed out" patient reports that he was feeling well when he woke up he denies any pain.  Level 5 caveat dementia.  Patient denies any recent illness, denies fever/chills, nausea/vomiting, diarrhea, chest pain/shortness of breath, vision changes or numbness/tingling, weakness or any additional concerns. - Supplemental history obtained from patient's wife who is at bedside.  She reports that patient was sitting in the dinner table eating some food, she turned around when she turned back to the patient he had slumped over the table and was profusely sweating.  She threw water on him and he gradually regained consciousness.  This happened around 2 weeks ago as well.  No preceding complaints.  HPI     Past Medical History:  Diagnosis Date  . Acute kidney injury ([place]) 12/27/2016  . Alzheimer's disease ([place])   . Anemia 12/27/2016  . Dementia ([place])   . Diabetes mellitus without complication ([place])   . GERD (gastroesophageal reflux disease)   . Hyperlipidemia   . Hypertension   . Kidney disease 12/27/2016   BUN 53 Creat 3.54  . Prostate cancer [facility])     Patient Active Problem List   Diagnosis Date Noted  . Syncope 08/16/2020  . Malignant neoplasm of prostate ([place]) 08/15/2017  . DM (diabetes mellitus), type 2, uncontrolled, with renal complications ([place]) 99991111  . AKI (acute  kidney injury) ([place]) 12/27/2016  . Hyperkalemia 12/27/2016  . GERD (gastroesophageal reflux disease) 12/27/2016  . Essential hypertension 12/27/2016  . Anemia 12/27/2016    Past Surgical History:  Procedure Laterality Date  . PROSTATE BIOPSY         Family History  Problem Relation Age of Onset  . Kidney disease Sister   . Cancer Neg Hx     Social History   Tobacco Use  . Smoking status: Never Smoker  . Smokeless tobacco: Never Used  Vaping Use  . Vaping Use: Never used  Substance Use Topics  . Alcohol use: No  . Drug use: No    Home Medications Prior to Admission medications   Medication Sig Start Date End Date Taking? Authorizing Provider  acetaminophen (TYLENOL) 325 MG tablet Take 650 mg by mouth every 6 (six) hours as needed for headache (pain).   Yes [provider]  amLODipine-olmesartan (AZOR) 5-20 MG tablet Take 1 tablet by mouth every morning.   Yes [provider]  aspirin EC 81 MG tablet Take 81 mg by mouth every morning.   Yes [provider]  carboxymethylcellulose 1 % ophthalmic solution Place 1 drop into both eyes at bedtime. Refresh   Yes [provider]  Chlorphen-Phenyleph-ASA (ALKA-SELTZER PLUS COLD PO) Take 1 tablet by mouth at bedtime as needed (sleep).   Yes [provider]  colesevelam (WELCHOL) 625 MG tablet Take 1,250 mg by mouth See admin instructions. Take 2 tablets (1250 mg) by mouth twice daily - 7am and 3pm  Yes [provider]  diclofenac (VOLTAREN) 75 MG EC tablet Take 75 mg by mouth 2 (two) times daily as needed (neck pain).   Yes [provider]  donepezil (ARICEPT) 10 MG tablet Take 20 mg by mouth at bedtime. For confusion/memory   Yes [provider]  ezetimibe (ZETIA) 10 MG tablet Take 10 mg by mouth every morning.   Yes [provider]  furosemide (LASIX) 40 MG tablet Take 20 mg by mouth 2 (two) times daily.    Yes [provider]  glipiZIDE  (GLUCOTROL XL) 5 MG 24 hr tablet Take 5 mg by mouth every morning. 06/30/20  Yes [provider]  Magnesium 500 MG TABS Take 500 mg by mouth daily as needed (constipation).   Yes [provider]  memantine (NAMENDA) 10 MG tablet Take 20 mg by mouth at bedtime. For confusion/memory   Yes [provider]  pantoprazole (PROTONIX) 40 MG tablet Take 40 mg by mouth daily as needed (acid reflux/heartburn).   Yes [provider]  pioglitazone (ACTOS) 30 MG tablet Take 30 mg by mouth every morning.   Yes [provider]  Propylene Glycol (SYSTANE BALANCE) 0.6 % SOLN Place 1 drop into both eyes 4 (four) times daily.   Yes [provider]  tamsulosin (FLOMAX) 0.4 MG CAPS capsule Take 0.4 mg by mouth at bedtime.   Yes [provider]  tiZANidine (ZANAFLEX) 4 MG tablet Take 4 mg by mouth every 12 (twelve) hours as needed for muscle spasms.   Yes [provider]  Vitamin D, Ergocalciferol, (DRISDOL) 50000 units CAPS capsule Take 50,000 Units by mouth every Wednesday.   Yes [provider]    Allergies    Patient has no known allergies.  Review of Systems   Review of Systems  Unable to perform ROS: Dementia    Physical Exam Updated Vital Signs BP (!) 160/65   Pulse (!) 54   Temp 97.8 F (36.6 C) (Oral)   Resp 14   Ht 6' (1.829 m)   SpO2 100%   BMI 32.33 kg/m   Physical Exam Constitutional:      General: He is not in acute distress.    Appearance: Normal appearance. He is well-developed. He is not ill-appearing or diaphoretic.  HENT:     Head: Normocephalic and atraumatic.  Eyes:     General: Vision grossly intact. Gaze aligned appropriately.     Pupils: Pupils are equal, round, and reactive to light.  Neck:     Trachea: Trachea and phonation normal.  Cardiovascular:     Rate and Rhythm: Normal rate and regular rhythm.  Pulmonary:     Effort: Pulmonary effort is normal. No respiratory distress.     Breath  sounds: Normal breath sounds.  Abdominal:     General: There is no distension.     Palpations: Abdomen is soft.     Tenderness: There is no abdominal tenderness. There is no guarding or rebound.  Musculoskeletal:        General: Normal range of motion.     Cervical back: Normal range of motion.  Skin:    General: Skin is warm and dry.  Neurological:     Mental Status: He is alert.     GCS: GCS eye subscore is 4. GCS verbal subscore is 5. GCS motor subscore is 6.     Comments: Speech is clear and goal oriented, follows commands Major Cranial nerves without deficit, no facial droop Normal strength  in upper and lower extremities bilaterally including dorsiflexion and plantar flexion, strong and equal grip strength Sensation normal to light and sharp touch Moves extremities without ataxia, coordination intact Normal finger to nose and rapid alternating movements Neg romberg, no pronator drift Normal gait Normal heel-shin and balance  Psychiatric:        Behavior: Behavior normal.     ED Results / Procedures / Treatments   Labs (all labs ordered are listed, but only abnormal results are displayed) Labs Reviewed  BASIC METABOLIC PANEL - Abnormal; Notable for the following components:      Result Value   Glucose, Bld 140 (*)    BUN 29 (*)    Creatinine, Ser 2.50 (*)    Calcium 8.4 (*)    GFR, Estimated 26 (*)    All other components within normal limits  CBC - Abnormal; Notable for the following components:   RBC 3.67 (*)    Hemoglobin 11.5 (*)    HCT 36.1 (*)    Platelets 101 (*)    All other components within normal limits  RESP PANEL BY RT-PCR (FLU A&B, COVID) ARPGX2  RESP PANEL BY RT-PCR (FLU A&B, COVID) ARPGX2  URINALYSIS, ROUTINE W REFLEX MICROSCOPIC  TSH  MAGNESIUM  CBC  CREATININE, SERUM  D-DIMER, QUANTITATIVE (NOT AT [facility])  CBG MONITORING, ED  TROPONIN I (HIGH SENSITIVITY)  TROPONIN I (HIGH SENSITIVITY)    EKG EKG Interpretation  Date/Time:  Sunday  August 16 2020 13:30:27 EST Ventricular Rate:  46 PR Interval:  154 QRS Duration: 82 QT Interval:  458 QTC Calculation: 400 R Axis:   4 Text Interpretation: Sinus bradycardia Otherwise normal ECG when comaprd to prior, slower rate than prior. No STEMI Confirmed by Antony Blackbird [phone]) on 08/16/2020 4:02:15 PM   Radiology DG Chest Port 1 View  Result Date: 08/16/2020 CLINICAL DATA:  Syncope EXAM: PORTABLE CHEST 1 VIEW COMPARISON:  None. FINDINGS: The cardiac silhouette appears borderline to mildly enlarged but the apparent size and may be exaggerated by portable technique. The hila and mediastinum are normal. No pneumothorax. No pulmonary nodules or masses. No focal infiltrates. IMPRESSION: The cardiac silhouette appears borderline to mildly enlarged but the apparent size may be exaggerated by portable technique. No other acute abnormalities. Electronically Signed   By: Dorise Bullion III M.D   On: 08/16/2020 17:23    Procedures Procedures (including critical care time)  Medications Ordered in ED Medications  sodium chloride 0.9 % bolus 500 mL (has no administration in time range)  acetaminophen (TYLENOL) tablet 650 mg (has no administration in time range)  aspirin EC tablet 81 mg (has no administration in time range)  diclofenac (VOLTAREN) EC tablet 75 mg (has no administration in time range)  colesevelam [facility]) tablet 1,250 mg (has no administration in time range)  ezetimibe (ZETIA) tablet 10 mg (has no administration in time range)  donepezil (ARICEPT) tablet 20 mg (has no administration in time range)  memantine (NAMENDA) tablet 20 mg (has no administration in time range)  tamsulosin (FLOMAX) capsule 0.4 mg (has no administration in time range)  carboxymethylcellulose 1 % ophthalmic solution 1 drop (has no administration in time range)  Propylene Glycol 0.6 % SOLN 1 drop (has no administration in time range)  enoxaparin (LOVENOX) injection 40 mg (has no administration in time  range)    ED Course  I have reviewed the triage vital signs and the nursing notes.  Pertinent labs & imaging results that were available during my care of the  patient were reviewed by me and considered in my medical decision making (see chart for details).    MDM Rules/Calculators/A&P                         Additional history obtained from: 1. Nursing notes from this visit. 2. Review of electronic medical records. 3. Family at bedside. ------------------- I ordered, reviewed and interpreted labs which include: CBC shows anemia 11.5, no leukocytosis.  Appears baseline BMP shows elevation of creatinine 2.5, BUN 29, increased from prior but similar to 3 years ago, no emergent electrolyte derangement or gap. High-sensitivity troponin within normal limits  EKG: Sinus bradycardia Otherwise normal ECG when comaprd to prior, slower rate than prior. No STEMI Confirmed by Antony Blackbird [phone]) on 08/16/2020 4:02:15 PM  CXR:  IMPRESSION:  The cardiac silhouette appears borderline to mildly enlarged but the  apparent size may be exaggerated by portable technique. No other  acute abnormalities.  ----- Discussed case with attending physician Dr. Sherry Ruffing, given patient's concerning story, recurrence of syncope and hypotension will consult medicine team for admission. - Consult called discussed case with Dr. Ouida Sills, patient was accepted to medicine team for admission.  They have asked that I place consult to cardiology for their input.  Patient reassessment, no acute distress, patient's wife is helping him use the urinal.  They state understanding of care plan they are agreeable for admission. - 6:26 PM: Consult with cardiologist Dr. Jonne Ply, they will see patient in consult  Case discussed with Dr. Sherry Ruffing during this visit who agrees with plan of care and admission.  Note: Portions of this report may have been transcribed using voice recognition software. Every effort was made to ensure  accuracy; however, inadvertent computerized transcription errors may still be present. Final Clinical Impression(s) / ED Diagnoses Final diagnoses:  Syncope and collapse    Rx / DC Orders ED Discharge Orders    None       Gari Crown 08/16/20 1830    Tegeler, Gwenyth Allegra, MD 08/16/20 2126

## 2020-08-16 NOTE — ED Triage Notes (Signed)
Pt to triage via [facility] from home.  Pt had syncopal episode after eating lunch.  Hx of dementia.  CBG 155.  Initial BP 80/.  EMS administered NS 500cc.  Repeat BP 107/.  18g LAC.  EMS reports abnormal EKG and had EDP review PTA.

## 2020-08-16 NOTE — ED Notes (Signed)
The pt came in earlier by gems from home  The pt had a syncopal episode at home with his wife  The pts wife reports that he has these episodes whenever his bp drops and he usually comes around but today he was out longer than usual  On arrival to the room a and o x4 no pain anywhere  Sinus brady

## 2020-08-16 NOTE — Consult Note (Signed)
Cardiology Consult    Patient ID: Cory Ramos MRN: [id number], DOB/AGE: 11-09-1945   Admit date: 08/16/2020 Date of Consult: 08/16/2020  Primary Physician: Posey Pronto, PA-C Primary Cardiologist: none Requesting Provider: Chrisandra Netters, MD  An e-consult was performed for this patient due to COVID-19 infection  Patient Profile    Cory Ramos is a 75 y.o. male with a history of prostate cancer, GERD, HTN, type 2 DM, anemia, CKD, and Alzheimer's disease (on Aricept). He is being seen today for the evaluation of syncope.   History of Present Illness    Wife witnessed him slump over while sitting at the kitchen table. Lost consciousness for several seconds and took a few minutes to return to baseline. No seizure like activity witnessed. No preceding palpitations and no prodrome. Has some baseline GERD symptoms but no new chest discomfort. No dyspnea. No injuries from the event. His Lasix was recently increased and he has been more sluggish since then. He had a similar but more minor event a week ago.  His wife called EMS and was found to be hypotensive, responding to IV fluids. ECG showed sinus bradycardia, no conduction abnormalities. No arrhythmias since ED arrival per report. Initial glucose here was normal. Initial BP on ED arrival was 121/58 and has risen significantly since then. Cr is 2.5, though most recent baseline is unclear. He has tested positive for SARS-CoV2.  Past Medical History   Past Medical History:  Diagnosis Date  . Acute kidney injury ([place]) 12/27/2016  . Alzheimer's disease ([place])   . Anemia 12/27/2016  . Dementia ([place])   . Diabetes mellitus without complication ([place])   . GERD (gastroesophageal reflux disease)   . Hyperlipidemia   . Hypertension   . Kidney disease 12/27/2016   BUN 53 Creat 3.54  . Prostate cancer [facility])     Past Surgical History:  Procedure Laterality Date  . PROSTATE BIOPSY       No Known Allergies Inpatient Medications     . [START ON 08/17/2020] aspirin EC  81 mg Oral q morning - 10a  . colesevelam  1,250 mg Oral BID  . donepezil  20 mg Oral QHS  . enoxaparin (LOVENOX) injection  30 mg Subcutaneous Q24H  . [START ON 08/17/2020] ezetimibe  10 mg Oral q morning - 10a  . [START ON 08/17/2020] insulin aspart  0-6 Units Subcutaneous TID WC  . memantine  20 mg Oral QHS  . polyvinyl alcohol  1 drop Both Eyes QHS  . tamsulosin  0.4 mg Oral QHS    Family History    Family History  Problem Relation Age of Onset  . Kidney disease Sister   . Cancer Neg Hx    He indicated that the status of his sister is unknown. He indicated that the status of his neg hx is unknown.   Social History    Social History   Socioeconomic History  . Marital status: Single    Spouse name: Not on file  . Number of children: Not on file  . Years of education: Not on file  . Highest education level: Not on file  Occupational History  . Occupation: retired  Tobacco Use  . Smoking status: Never Smoker  . Smokeless tobacco: Never Used  Vaping Use  . Vaping Use: Never used  Substance and Sexual Activity  . Alcohol use: No  . Drug use: No  . Sexual activity: Never  Other Topics Concern  . Not on file  Social History Narrative  Married.    Lives in [place].   Has 2 adult children. 5 grandchildren.   Retired Art gallery manager.   Social Determinants of Health   Financial Resource Strain: Not on file  Food Insecurity: Not on file  Transportation Needs: Not on file  Physical Activity: Not on file  Stress: Not on file  Social Connections: Not on file  Intimate Partner Violence: Not on file     Review of Systems    A comprehensive review of systems was obtained with pertinent positives and negatives noted in the HPI.  Physical Exam    Blood pressure (!) 179/60, pulse (!) 57, temperature 97.8 F (36.6 C), temperature source Oral, resp. rate 16, height 6' (1.829 m), SpO2 98 %.    No intake or output data in the  24 hours ending 08/16/20 2300 Wt Readings from Last 3 Encounters:  08/14/17 108.1 kg  05/03/17 103 kg  12/27/16 106.5 kg    CONSTITUTIONAL: alert and in no distress HEENT: atraumatic, conjunctiva normal, EOM intact, pupils equal. NECK: symmetric, no masses, no JVD CARDIOVASCULAR: Regular rhythm. Systolic ejection murmur. Normal S1/S2. Radial pulses intact. No carotid bruits. PULMONARY/CHEST WALL: no deformities, normal breath sounds bilaterally, normal work of breathing ABDOMINAL: soft, non-tender, non-distended EXTREMITIES: no edema, warm and well-perfused SKIN: Dry and intact without apparent rashes or wounds.  NEUROLOGIC: alert, no abnormal movements, cranial nerves grossly intact.   Labs    Cardiac Panel (last 3 results) Recent Labs    08/16/20 1323 08/16/20 1640  TROPONINIHS 12 14    Lab Results  Component Value Date   WBC 5.8 08/16/2020   HGB 11.5 (L) 08/16/2020   HCT 36.1 (L) 08/16/2020   MCV 98.4 08/16/2020   PLT 101 (L) 08/16/2020    Recent Labs  Lab 08/16/20 1323  NA 143  K 4.0  CL 106  CO2 26  BUN 29*  CREATININE 2.50*  CALCIUM 8.4*  GLUCOSE 140*   No results found for: CHOL, HDL, LDLCALC, TRIG Lab Results  Component Value Date   DDIMER 0.63 (H) 08/16/2020     Radiology Studies    DG Chest Port 1 View  Result Date: 08/16/2020 CLINICAL DATA:  Syncope EXAM: PORTABLE CHEST 1 VIEW COMPARISON:  None. FINDINGS: The cardiac silhouette appears borderline to mildly enlarged but the apparent size and may be exaggerated by portable technique. The hila and mediastinum are normal. No pneumothorax. No pulmonary nodules or masses. No focal infiltrates. IMPRESSION: The cardiac silhouette appears borderline to mildly enlarged but the apparent size may be exaggerated by portable technique. No other acute abnormalities. Electronically Signed   By: Dorise Bullion III M.D   On: 08/16/2020 17:23    ECG & Cardiac Imaging    ECG: sinus bradycardia, early  repolarization, nonspecific ST-T wave abnormality - personally reviewed.  Assessment & Plan    Syncope: his ECGs show sinus bradycardia with a rate in the 50s, but no evidence of any conduction abnormalities, QT is normal, and we have not captured any tachyarrhythmias on telemetry thus far. He has no angina, symptoms of heart failure, or cardiac history to suggest an underlying etiology for any arrhythmia. He is on donepezil, which can cause bradycardia, as well as several other medications that may commonly contribute to syncope in the geriatric population, including amlodipine, furosemide, tamsulosin, tizanidine, and memantine. His symptoms also correlate with increase in his Lasix dose coinciding with COVID-19 infection, further increasing his risk for orthostatic hypotension. D-dimer elevated, but could be explained by  COVID-19, no other signs of PE. No clear evidence of seizure or stroke.  - Strongly recommend addressing polypharmacy as he is on multiple potential offending medications, as outlined above. However, will probably need to cautiously re-introduce at least some anti-hypertensive given his severely elevated blood pressure. Will defer to primary medicine team. - Check and document orthostatic vital signs - Monitor on telemetry  - Obtain echocardiogram in the morning - If no arrhythmia observed inpatient, can place Prevent[employer] monitor at discharge. - Okay to follow-up with PCP if above work-up does not reveal any significant abnormalities.   Signed, Marykay Lex, MD 08/16/2020, 11:00 PM  For questions or updates, please contact   Please consult [email or web address] for contact info under Cardiology/STEMI.

## 2020-08-16 NOTE — H&P (Addendum)
[facility] Admission History and Physical Service Pager: [phone]  Patient name: Cory Ramos Medical record number: [id number] Date of birth: 1945-12-24 Age: 75 y.o. Gender: male  Primary Care Provider: Posey Pronto, PA-C Consultants: cardiology Code Status: full Preferred Emergency Contact: wife  Chief Complaint: syncope  Assessment and Plan: Cory Ramos is a 75 y.o. male presenting with syncope. PMH is significant for prostate cancer, GERD, hypertension, noninsulin-dependent DM 2, anemia, Alzheimer's disease  Syncope- occurring twice in a week. Cardiac in nature based on bradycardia, hypotension and description of possible orthostatic symptoms since increasing Lasix dose.  Given patient's description of GERD sounding similar to angina, cannot rule out ACS. No seizure like activity. PE unlikely. No focal weaknesses.  Patient is asymptomatic and wife describes him at baseline. HR in 50s while in ED. -Admit to telemetry, attending Dr. Ardelia Mems - consult to cardiology, appreciate recs -Trend troponins -ECG repeat a.m. - echo - orthostatic vitals - hold lasix  HTN-Home meds: Amlodipine olmesartan 5-20 daily, Lasix 20 twice daily -Holding home blood pressure medicines that can worsen bradycardia. -Monitor blood pressure for hypertensive symptoms  DM2-chronic. Home meds: Glipizide 5 daily, pioglitazone 30 daily. Glucose 140 on admission - sSSI - hold home medications - hgb A1c am  Alzheimer's-chronic, stable. Home meds: Donepezil 20 mg daily, memantine 20 daily - continue home meds - encourage wife to stay bedside for orientation - delirium precautions  Urinary retention-tamsulosin 0.4 daily - continue home med - monitor I/O  HLD-colesevelam 625 mg twice daily, Zetia 10 daily - continue home meds - f/u lipid panel  Normocytic anemia- chronic and stable. hgb 11.5 on admission  AoCKD- Cr 2.5 on admission. Baseline Prerenal from poor  circulation - BMP am    FEN/GI: Heart healthy diet Prophylaxis: Lovenox  Disposition: Cardiac telemetry  History of Present Illness:  Cory Ramos is a 75 y.o. male presenting with syncope. Pleasant 75 year old male presents today after an episode of syncope lasting a few seconds.  The patient remembers the episode happening but is unable to describe the events surrounding the episode.  It sounds as though he did not have any prodromal symptoms.  He was sitting at the kitchen table at the time and his wife noticed him start to fade out and then slumped forward onto the table.  During the syncopal episode there was no seizure-like activity, loss of bowel or bladder control.  Wife states that the episode lasted several seconds and he was nonresponsive before he woke up but then he took several minutes to return back to his baseline.  Wife endorses that he is back to his recent baseline currently.  However, for the past 2 weeks he has been more sluggish than usual in this timeframe coincides with increasing his Lasix dose from once daily to twice per day.  Patient had a similar episode to this approximately a week ago that did not last nearly as long.  Patient and wife deny ever having an arrhythmia or slow heart rate diagnosis.  He does not see a cardiologist.  He is seen at the [facility] and his other PCP is Dr. Leonel Ramsay. Patient describes feeling some left-sided chest tightness and pressure which is his normal GERD symptoms.  To resolve the symptoms, he typically eats a pickle and a BLT from the cookout.  That has worked every single time except for this last time. Wife called EMS while he was unconscious during this episode.  When they arrived, he was hypotensive but alert.  Has not had any recent illnesses or sick symptoms.  Review Of Systems: Per HPI with the following additions:  Review of Systems   Patient Active Problem List   Diagnosis Date Noted  . Syncope 08/16/2020  .  Malignant neoplasm of prostate ([place]) 08/15/2017  . DM (diabetes mellitus), type 2, uncontrolled, with renal complications ([place]) 40/98/1191  . AKI (acute kidney injury) ([place]) 12/27/2016  . Hyperkalemia 12/27/2016  . GERD (gastroesophageal reflux disease) 12/27/2016  . Essential hypertension 12/27/2016  . Anemia 12/27/2016    Past Medical History: Past Medical History:  Diagnosis Date  . Acute kidney injury ([place]) 12/27/2016  . Alzheimer's disease ([place])   . Anemia 12/27/2016  . Dementia ([place])   . Diabetes mellitus without complication ([place])   . GERD (gastroesophageal reflux disease)   . Hyperlipidemia   . Hypertension   . Kidney disease 12/27/2016   BUN 53 Creat 3.54  . Prostate cancer [facility])     Past Surgical History: Past Surgical History:  Procedure Laterality Date  . PROSTATE BIOPSY      Social History: Social History   Tobacco Use  . Smoking status: Never Smoker  . Smokeless tobacco: Never Used  Vaping Use  . Vaping Use: Never used  Substance Use Topics  . Alcohol use: No  . Drug use: No   Additional social history: Lives at home with his wife, veteran Please also refer to relevant sections of EMR.  Family History: Family History  Problem Relation Age of Onset  . Kidney disease Sister   . Cancer Neg Hx     Allergies and Medications: No Known Allergies No current facility-administered medications on file prior to encounter.   Current Outpatient Medications on File Prior to Encounter  Medication Sig Dispense Refill  . acetaminophen (TYLENOL) 325 MG tablet Take 650 mg by mouth every 6 (six) hours as needed for headache (pain).    Marland Kitchen amLODipine-olmesartan (AZOR) 5-20 MG tablet Take 1 tablet by mouth every morning.    Marland Kitchen aspirin EC 81 MG tablet Take 81 mg by mouth every morning.    . carboxymethylcellulose 1 % ophthalmic solution Place 1 drop into both eyes at bedtime. Refresh    . Chlorphen-Phenyleph-ASA (ALKA-SELTZER PLUS COLD PO) Take 1 tablet by mouth at  bedtime as needed (sleep).    . colesevelam (WELCHOL) 625 MG tablet Take 1,250 mg by mouth See admin instructions. Take 2 tablets (1250 mg) by mouth twice daily - 7am and 3pm    . diclofenac (VOLTAREN) 75 MG EC tablet Take 75 mg by mouth 2 (two) times daily as needed (neck pain).    Marland Kitchen donepezil (ARICEPT) 10 MG tablet Take 20 mg by mouth at bedtime. For confusion/memory    . ezetimibe (ZETIA) 10 MG tablet Take 10 mg by mouth every morning.    . furosemide (LASIX) 40 MG tablet Take 20 mg by mouth 2 (two) times daily.     Marland Kitchen glipiZIDE (GLUCOTROL XL) 5 MG 24 hr tablet Take 5 mg by mouth every morning.    . Magnesium 500 MG TABS Take 500 mg by mouth daily as needed (constipation).    . memantine (NAMENDA) 10 MG tablet Take 20 mg by mouth at bedtime. For confusion/memory    . pantoprazole (PROTONIX) 40 MG tablet Take 40 mg by mouth daily as needed (acid reflux/heartburn).    . pioglitazone (ACTOS) 30 MG tablet Take 30 mg by mouth every morning.    Marland Kitchen Propylene Glycol (SYSTANE BALANCE) 0.6 % SOLN  Place 1 drop into both eyes 4 (four) times daily.    . tamsulosin (FLOMAX) 0.4 MG CAPS capsule Take 0.4 mg by mouth at bedtime.    Marland Kitchen tiZANidine (ZANAFLEX) 4 MG tablet Take 4 mg by mouth every 12 (twelve) hours as needed for muscle spasms.    . Vitamin D, Ergocalciferol, (DRISDOL) 50000 units CAPS capsule Take 50,000 Units by mouth every Wednesday.      Objective: BP (!) 160/65   Pulse (!) 54   Temp 97.8 F (36.6 C) (Oral)   Resp 14   Ht 6' (1.829 m)   SpO2 100%   BMI 32.33 kg/m  Exam: General: Pleasant, NAD Eyes: EOMI, PERRL ENTM: Moist mucous membranes, negative erythema in oropharynx Neck: Negative cervical lymphadenopathy Cardiovascular: Regular rhythm, slow rate, increased PMI, T7-S1 present, systolic murmur Respiratory: CTAB, no increased WOB Gastrointestinal: Soft, nondistended, nontender MSK: Normal build Derm: No rashes or lesions Neuro: Alert and oriented to self and place.  Answers  questions appropriately.  No focal deficits.  Cranial nerves grossly intact. Psych: Normal mood and affect  Labs and Imaging: CBC BMET  Recent Labs  Lab 08/16/20 1323  WBC 5.8  HGB 11.5*  HCT 36.1*  PLT 101*   Recent Labs  Lab 08/16/20 1323  NA 143  K 4.0  CL 106  CO2 26  BUN 29*  CREATININE 2.50*  GLUCOSE 140*  CALCIUM 8.4*     EKG: Sinus bradycardia at 46 bpm  DG Chest Port 1 View  Result Date: 08/16/2020 CLINICAL DATA:  Syncope EXAM: PORTABLE CHEST 1 VIEW COMPARISON:  None. FINDINGS: The cardiac silhouette appears borderline to mildly enlarged but the apparent size and may be exaggerated by portable technique. The hila and mediastinum are normal. No pneumothorax. No pulmonary nodules or masses. No focal infiltrates. IMPRESSION: The cardiac silhouette appears borderline to mildly enlarged but the apparent size may be exaggerated by portable technique. No other acute abnormalities. Electronically Signed   By: Dorise Bullion III M.D   On: 08/16/2020 17:23    Richarda Osmond, DO 08/16/2020, 6:18 PM PGY-3, [place] pager: [phone], text pages welcome

## 2020-08-17 ENCOUNTER — Observation Stay (HOSPITAL_BASED_OUTPATIENT_CLINIC_OR_DEPARTMENT_OTHER): Payer: Medicare Other

## 2020-08-17 ENCOUNTER — Other Ambulatory Visit: Payer: Self-pay | Admitting: Cardiology

## 2020-08-17 DIAGNOSIS — I361 Nonrheumatic tricuspid (valve) insufficiency: Secondary | ICD-10-CM

## 2020-08-17 DIAGNOSIS — R9431 Abnormal electrocardiogram [ECG] [EKG]: Secondary | ICD-10-CM

## 2020-08-17 DIAGNOSIS — R55 Syncope and collapse: Secondary | ICD-10-CM | POA: Diagnosis not present

## 2020-08-17 LAB — URINALYSIS, ROUTINE W REFLEX MICROSCOPIC
Bacteria, UA: NONE SEEN
Bilirubin Urine: NEGATIVE
Glucose, UA: NEGATIVE mg/dL
Hgb urine dipstick: NEGATIVE
Ketones, ur: NEGATIVE mg/dL
Leukocytes,Ua: NEGATIVE
Nitrite: NEGATIVE
Protein, ur: 100 mg/dL — AB
Specific Gravity, Urine: 1.013 (ref 1.005–1.030)
pH: 6 (ref 5.0–8.0)

## 2020-08-17 LAB — BASIC METABOLIC PANEL
Anion gap: 12 (ref 5–15)
BUN: 27 mg/dL — ABNORMAL HIGH (ref 8–23)
CO2: 24 mmol/L (ref 22–32)
Calcium: 8.6 mg/dL — ABNORMAL LOW (ref 8.9–10.3)
Chloride: 103 mmol/L (ref 98–111)
Creatinine, Ser: 2.44 mg/dL — ABNORMAL HIGH (ref 0.61–1.24)
GFR, Estimated: 27 mL/min — ABNORMAL LOW (ref >60)
Glucose, Bld: 225 mg/dL — ABNORMAL HIGH (ref 70–99)
Potassium: 4.6 mmol/L (ref 3.5–5.1)
Sodium: 139 mmol/L (ref 135–145)

## 2020-08-17 LAB — CBG MONITORING, ED
Glucose-Capillary: 98 mg/dL (ref 70–99)
Glucose-Capillary: 91 mg/dL (ref 70–99)
Glucose-Capillary: 150 mg/dL — ABNORMAL HIGH (ref 70–99)
Glucose-Capillary: 122 mg/dL — ABNORMAL HIGH (ref 70–99)

## 2020-08-17 LAB — HEMOGLOBIN A1C
Hgb A1c MFr Bld: 5.9 % — ABNORMAL HIGH (ref 4.8–5.6)
Mean Plasma Glucose: 122.63 mg/dL

## 2020-08-17 LAB — ECHOCARDIOGRAM COMPLETE
Area-P 1/2: 3.31 cm2
Calc EF: 65.2 %
Height: 72 in
S' Lateral: 2.8 cm
Single Plane A2C EF: 69.3 %
Single Plane A4C EF: 61.1 %

## 2020-08-17 MED ORDER — SODIUM CHLORIDE 0.9 % IV SOLN: 100.0000 mg | Freq: Every day | INTRAVENOUS | Status: DC

## 2020-08-17 MED ORDER — IRBESARTAN 300 MG PO TABS: 150.0000 mg | ORAL_TABLET | Freq: Every day | ORAL | Status: DC

## 2020-08-17 MED ORDER — FUROSEMIDE 40 MG PO TABS: 20.0000 mg | ORAL_TABLET | Freq: Every day | ORAL | 0 refills | Status: AC

## 2020-08-17 MED ORDER — SODIUM CHLORIDE 0.9 % IV SOLN
200.0000 mg | Freq: Once | INTRAVENOUS | Status: AC
  Administered 2020-08-17: 200 mg via INTRAVENOUS
  Filled 2020-08-17: qty 40

## 2020-08-17 NOTE — Progress Notes (Signed)
  Echocardiogram 2D Echocardiogram has been performed.  Cory Ramos 08/17/2020, 9:39 AM

## 2020-08-17 NOTE — ED Notes (Signed)
Lunch Tray Ordered 1115. 

## 2020-08-17 NOTE — Evaluation (Signed)
Physical Therapy Evaluation and Discharge Patient Details Name: Cory Ramos MRN: [id number] DOB: 1945-08-14 Today's Date: 08/17/2020   History of Present Illness  Pt is a 75 y/o male admitted secondary to syncopal episode. Found to be COVID +. PMH includes HTN, CKD, prostate cancer, dementia, and DM.  Clinical Impression  Pt admitted secondary to problem above with deficits below. Pt overall steady during gait within the room. No LOB Noted and pt asymptomatic throughout. Had pt march in place to simulate stair navigation. Pt with hx of dementia, however, A and O X4 and very appropriate throughout. Reports he is close to baseline. Wife available to assist as needed. No further acute PT needs. Will sign off. If needs change, please re-consult.     Follow Up Recommendations No PT follow up;Supervision for mobility/OOB    Equipment Recommendations  None recommended by PT    Recommendations for Other Services       Precautions / Restrictions Precautions Precautions: Fall Restrictions Weight Bearing Restrictions: No      Mobility  Bed Mobility Overal bed mobility: Modified Independent                  Transfers Overall transfer level: Modified independent                  Ambulation/Gait Ambulation/Gait assistance: Supervision Gait Distance (Feet): 40 Feet Assistive device: None Gait Pattern/deviations: Step-through pattern;Decreased stride length Gait velocity: Decreased   General Gait Details: Ambulated laps in ED room as unable to leave room secondary to covid. No LOB noted and pt asymptomatic throughout.  Stairs Stairs: Yes       General stair comments: Had pt march in order to simulate stair navigation as unable to leave ED room. Supervision for safety. No LOB noted.  Wheelchair Mobility    Modified Rankin (Stroke Patients Only)       Balance Overall balance assessment: No apparent balance deficits (not formally assessed)                                            Pertinent Vitals/Pain Pain Assessment: No/denies pain    Home Living Family/patient expects to be discharged to:: Private residence Living Arrangements: Spouse/significant other Available Help at Discharge: Family;Available 24 hours/day Type of Home: House Home Access: Stairs to enter Entrance Stairs-Rails: None Entrance Stairs-Number of Steps: 3 Home Layout: One level Home Equipment: None      Prior Function Level of Independence: Independent               Hand Dominance        Extremity/Trunk Assessment   Upper Extremity Assessment Upper Extremity Assessment: Overall WFL for tasks assessed    Lower Extremity Assessment Lower Extremity Assessment: Generalized weakness    Cervical / Trunk Assessment Cervical / Trunk Assessment: Normal  Communication   Communication: No difficulties  Cognition Arousal/Alertness: Awake/alert Behavior During Therapy: WFL for tasks assessed/performed Overall Cognitive Status: History of cognitive impairments - at baseline                                 General Comments: Pt alert and oriented X4. Dementia at baseline per chart.      General Comments General comments (skin integrity, edema, etc.): VSS throughout    Exercises     Assessment/Plan  PT Assessment Patent does not need any further PT services  PT Problem List         PT Treatment Interventions      PT Goals (Current goals can be found in the Care Plan section)  Acute Rehab PT Goals Patient Stated Goal: to go home PT Goal Formulation: With patient Time For Goal Achievement: 08/17/20 Potential to Achieve Goals: Good    Frequency     Barriers to discharge        Co-evaluation               AM-PAC PT "6 Clicks" Mobility  Outcome Measure Help needed turning from your back to your side while in a flat bed without using bedrails?: None Help needed moving from lying on your back to sitting  on the side of a flat bed without using bedrails?: None Help needed moving to and from a bed to a chair (including a wheelchair)?: None Help needed standing up from a chair using your arms (e.g., wheelchair or bedside chair)?: None Help needed to walk in hospital room?: None Help needed climbing 3-5 steps with a railing? : A Little 6 Click Score: 23    End of Session   Activity Tolerance: Patient tolerated treatment well Patient left: in bed;with call bell/phone within reach (in bed in ED) Nurse Communication: Mobility status PT Visit Diagnosis: Other abnormalities of gait and mobility (R26.89)    Time: 2585-2778 PT Time Calculation (min) (ACUTE ONLY): 18 min   Charges:   PT Evaluation $PT Eval Low Complexity: 1 Low          Lou Miner, DPT  Acute Rehabilitation Services  Pager: [phone] Office: [phone]   Rudean Hitt 08/17/2020, 4:22 PM

## 2020-08-17 NOTE — Progress Notes (Signed)
Progress Note  Due to the COVID-19 pandemic, this visit was completed with telemedicine (audio/video) technology to reduce patient and provider exposure as well as to preserve personal protective equipment.   Patient Name: Cory Ramos Date of Encounter: 08/17/2020  Primary Cardiologist: New  Subjective   No CP or dyspnea; no dizziness  Inpatient Medications    Scheduled Meds: . aspirin EC  81 mg Oral q morning - 10a  . colesevelam  1,250 mg Oral BID  . donepezil  20 mg Oral QHS  . enoxaparin (LOVENOX) injection  30 mg Subcutaneous Q24H  . ezetimibe  10 mg Oral q morning - 10a  . insulin aspart  0-6 Units Subcutaneous TID WC  . memantine  20 mg Oral QHS  . polyvinyl alcohol  1 drop Both Eyes QHS  . tamsulosin  0.4 mg Oral QHS   Continuous Infusions: . remdesivir 200 mg in sodium chloride 0.9% 250 mL IVPB     Followed by  . [START ON 08/18/2020] remdesivir 100 mg in NS 100 mL     PRN Meds: acetaminophen, diclofenac   Vital Signs    Vitals:   08/17/20 0900 08/17/20 1000 08/17/20 1100 08/17/20 1200  BP: (!) 177/70 (!) 173/72 (!) 163/68 (!) 153/63  Pulse: 96 66 71 60  Resp: (!) 25 (!) 29 (!) 31 14  Temp:      TempSrc:      SpO2: 99% 98% 100% 98%  Height:       No intake or output data in the 24 hours ending 08/17/20 1328 Last 3 Weights 08/14/2017 05/03/2017 12/27/2016  Weight (lbs) 238 lb 6.4 oz 227 lb 234 lb 11.2 oz  Weight (kg) 108.138 kg 102.967 kg 106.459 kg      Telemetry    Sinus with occasional PVC and 4 beats NSVT- Personally Reviewed   Physical Exam   VITAL SIGNS:  reviewed  Labs    Chemistry Recent Labs  Lab 08/16/20 1323 08/17/20 0956  NA 143 139  K 4.0 4.6  CL 106 103  CO2 26 24  GLUCOSE 140* 225*  BUN 29* 27*  CREATININE 2.50* 2.44*  CALCIUM 8.4* 8.6*  GFRNONAA 26* 27*  ANIONGAP 11 12     Hematology Recent Labs  Lab 08/16/20 1323  WBC 5.8  RBC 3.67*  HGB 11.5*  HCT 36.1*  MCV 98.4  MCH 31.3  MCHC 31.9  RDW 13.1  PLT  101*    Cardiac EnzymesNo results for input(s): TROPONINI in the last 168 hours. No results for input(s): TROPIPOC in the last 168 hours.   BNPNo results for input(s): BNP, PROBNP in the last 168 hours.   DDimer  Recent Labs  Lab 08/16/20 1816  DDIMER 0.63*     Radiology    DG Chest Port 1 View  Result Date: 08/16/2020 CLINICAL DATA:  Syncope EXAM: PORTABLE CHEST 1 VIEW COMPARISON:  None. FINDINGS: The cardiac silhouette appears borderline to mildly enlarged but the apparent size and may be exaggerated by portable technique. The hila and mediastinum are normal. No pneumothorax. No pulmonary nodules or masses. No focal infiltrates. IMPRESSION: The cardiac silhouette appears borderline to mildly enlarged but the apparent size may be exaggerated by portable technique. No other acute abnormalities. Electronically Signed   By: Dorise Bullion III M.D   On: 08/16/2020 17:23   ECHOCARDIOGRAM COMPLETE  Result Date: 08/17/2020    ECHOCARDIOGRAM REPORT   Patient Name:   Cory Ramos Date of Exam: 08/17/2020 Medical Rec #:  [id number]    Height:       72.0 in Accession #:    [id number]   Weight:       238.4 lb Date of Birth:  1946-07-01    BSA:          2.296 m Patient Age:    47 years     BP:           177/70 mmHg Patient Gender: M            HR:           70 bpm. Exam Location:  Inpatient Procedure: 2D Echo, Cardiac Doppler and Color Doppler Indications:    R94.31 Abnormal EKG  History:        Patient has no prior history of Echocardiogram examinations.                 Signs/Symptoms:Syncope, Alzheimer's and Altered Mental Status;                 Risk Factors:Hypertension, Dyslipidemia and Diabetes. Covid                 positive. Cancer.  Sonographer:    Roseanna Rainbow RDCS Referring Phys: [id number] [place]  1. Left ventricular ejection fraction, by estimation, is 65 to 70%. The left ventricle has normal function. The left ventricle has no regional wall motion abnormalities. There is  mild concentric left ventricular hypertrophy. Left ventricular diastolic parameters are consistent with Grade I diastolic dysfunction (impaired relaxation).  2. Right ventricular systolic function is normal. The right ventricular size is normal. There is mildly elevated pulmonary artery systolic pressure.  3. The mitral valve is rheumatic. Trivial mitral valve regurgitation. No evidence of mitral stenosis.  4. The aortic valve is tricuspid. There is moderate calcification of the aortic valve. There is moderate thickening of the aortic valve. Aortic valve regurgitation is not visualized. Mild to moderate aortic valve sclerosis/calcification is present, without any evidence of aortic stenosis.  5. There is mild dilatation of the ascending aorta, measuring 38 mm.  6. The inferior vena cava is normal in size with greater than 50% respiratory variability, suggesting right atrial pressure of 3 mmHg. FINDINGS  Left Ventricle: Left ventricular ejection fraction, by estimation, is 65 to 70%. The left ventricle has normal function. The left ventricle has no regional wall motion abnormalities. The left ventricular internal cavity size was normal in size. There is  mild concentric left ventricular hypertrophy. Left ventricular diastolic parameters are consistent with Grade I diastolic dysfunction (impaired relaxation). Normal left ventricular filling pressure. Right Ventricle: The right ventricular size is normal. No increase in right ventricular wall thickness. Right ventricular systolic function is normal. There is mildly elevated pulmonary artery systolic pressure. The tricuspid regurgitant velocity is 2.73  m/s, and with an assumed right atrial pressure of 8 mmHg, the estimated right ventricular systolic pressure is [id number]. Left Atrium: Left atrial size was normal in size. Right Atrium: Right atrial size was normal in size. Pericardium: There is no evidence of pericardial effusion. Mitral Valve: The mitral valve is  rheumatic. Mild mitral annular calcification. Trivial mitral valve regurgitation. No evidence of mitral valve stenosis. Tricuspid Valve: The tricuspid valve is normal in structure. Tricuspid valve regurgitation is mild . No evidence of tricuspid stenosis. Aortic Valve: The aortic valve is tricuspid. There is moderate calcification of the aortic valve. There is moderate thickening of the aortic valve. Aortic valve regurgitation is not visualized. Mild to moderate  aortic valve sclerosis/calcification is present, without any evidence of aortic stenosis. Pulmonic Valve: The pulmonic valve was normal in structure. Pulmonic valve regurgitation is not visualized. No evidence of pulmonic stenosis. Aorta: The aortic root is normal in size and structure. There is mild dilatation of the ascending aorta, measuring 38 mm. Venous: The inferior vena cava is normal in size with greater than 50% respiratory variability, suggesting right atrial pressure of 3 mmHg. IAS/Shunts: The interatrial septum appears to be lipomatous. No atrial level shunt detected by color flow Doppler.  LEFT VENTRICLE PLAX 2D LVIDd:         4.70 cm     Diastology LVIDs:         2.80 cm     LV e' medial:    6.14 cm/s LV PW:         1.60 cm     LV E/e' medial:  12.0 LV IVS:        1.20 cm     LV e' lateral:   9.03 cm/s LVOT diam:     2.10 cm     LV E/e' lateral: 8.2 LV SV:         89 LV SV Index:   39 LVOT Area:     3.46 cm  LV Volumes (MOD) LV vol d, MOD A2C: 90.8 ml LV vol d, MOD A4C: 77.7 ml LV vol s, MOD A2C: 27.9 ml LV vol s, MOD A4C: 30.2 ml LV SV MOD A2C:     62.9 ml LV SV MOD A4C:     77.7 ml LV SV MOD BP:      55.1 ml RIGHT VENTRICLE             IVC RV S prime:     21.60 cm/s  IVC diam: 1.30 cm TAPSE (M-mode): 2.1 cm LEFT ATRIUM             Index       RIGHT ATRIUM           Index LA diam:        3.70 cm 1.61 cm/m  RA Area:     13.40 cm LA Vol (A2C):   44.8 ml 19.51 ml/m RA Volume:   30.80 ml  13.42 ml/m LA Vol (A4C):   56.5 ml 24.61 ml/m LA  Biplane Vol: 52.1 ml 22.69 ml/m  AORTIC VALVE LVOT Vmax:   145.00 cm/s LVOT Vmean:  107.000 cm/s LVOT VTI:    0.258 m  AORTA Ao Root diam: 3.50 cm Ao Asc diam:  3.80 cm MITRAL VALVE               TRICUSPID VALVE MV Area (PHT): 3.31 cm    TR Peak grad:   29.8 mmHg MV Decel Time: 229 msec    TR Vmax:        273.00 cm/s MV E velocity: 73.70 cm/s MV A velocity: 90.20 cm/s  SHUNTS MV E/A ratio:  0.82        Systemic VTI:  0.26 m                            Systemic Diam: 2.10 cm Fransico Him MD Electronically signed by Fransico Him MD Signature Date/Time: 08/17/2020/12:06:56 PM    Final     Cardiac Studies   1. Left ventricular ejection fraction, by estimation, is 65 to 70%. The  left ventricle has normal function. The left ventricle has no  regional  wall motion abnormalities. There is mild concentric left ventricular  hypertrophy. Left ventricular diastolic  parameters are consistent with Grade I diastolic dysfunction (impaired  relaxation).  2. Right ventricular systolic function is normal. The right ventricular  size is normal. There is mildly elevated pulmonary artery systolic  pressure.  3. The mitral valve is rheumatic. Trivial mitral valve regurgitation. No  evidence of mitral stenosis.  4. The aortic valve is tricuspid. There is moderate calcification of the  aortic valve. There is moderate thickening of the aortic valve. Aortic  valve regurgitation is not visualized. Mild to moderate aortic valve  sclerosis/calcification is present,  without any evidence of aortic stenosis.  5. There is mild dilatation of the ascending aorta, measuring 38 mm.  6. The inferior vena cava is normal in size with greater than 50%  respiratory variability, suggesting right atrial pressure of 3 mmHg.   Patient Profile     75 y.o. male with past medical history of hypertension, diabetes mellitus, chronic kidney disease, dementia, prostate cancer with syncope.   Assessment & Plan    1 syncope-etiology  unclear.  No preceding nausea, diaphoresis, chest pain, dyspnea or palpitations.  Question contribution from dehydration.  Echocardiogram shows preserved LV function.  No diagnostic rhythm disturbances on telemetry.  Patient can be discharged from a cardiac standpoint with outpatient 30-day monitor.  I have instructed him not to drive for 6 months following his recent event.  2 hypertension-follow blood pressure and resume amlodipine as needed.  Would decrease Lasix to 20 mg daily at discharge.  3 chronic stage IV kidney disease-patient will need follow-up with nephrology after discharge.  4 Covid infection-Per primary care.  Cardiology will sign off.  Please call with questions.  We will arrange 30-day monitor following discharge once he recovers from Covid.  Follow-up in our office 4 to 6 weeks.  For questions or updates, please contact [place] Please consult [email or web address] for contact info under        Signed, Kirk Ruths, MD  08/17/2020, 1:28 PM

## 2020-08-17 NOTE — Progress Notes (Signed)
Patient scheduled for outpatient Remdesivir infusions at 1:30 on 1/11 and 1/12 [facility]. Please inform the patient to park at [place], as staff will be escorting the patient through the [place] entrance of the hospital. Appointments take approximately 45 minutes.    There is a wave flag banner located near the entrance on [address]. Turn into this entrance and immediately turn left or right and park in 1 of the 10 designated Covid Infusion Parking spots. There is a phone number on the sign, please call and let the staff know what spot you are in and we will come out and get you. For questions call [phone].  Thanks.

## 2020-08-17 NOTE — Discharge Instructions (Signed)
Patient scheduled for outpatient Remdesivir infusions at 1:30 on 1/11 and 1/12 [facility]. Please inform the patient to park at [place], as staff will be escorting the patient through the [place] entrance of the hospital. Appointments take approximately 45 minutes.    There is a wave flag banner located near the entrance on [address]. Turn into this entrance and immediately turn left or right and park in 1 of the 10 designated Covid Infusion Parking spots. There is a phone number on the sign, please call and let the staff know what spot you are in and we will come out and get you. For questions call [phone].  Thanks.   We have made several medication changes for you at the time of discharge.  We are recommending against your diclofenac, we are stopping your Aricept, we are stopping your glipizide and pioglitazone, we are stopping your tizanidine.  We are reducing your Lasix dose to 20 mg once per day.

## 2020-08-17 NOTE — ED Notes (Signed)
Tele  Breakfast Ordered 

## 2020-08-17 NOTE — Discharge Summary (Signed)
[facility] Discharge Summary  Patient name: Cory Ramos Medical record number: [id number] Date of birth: 1945-12-07 Age: 75 y.o. Gender: male Date of Admission: 08/16/2020  Date of Discharge: 08/17/2020 Admitting Physician: Richarda Osmond, DO  Primary Care Provider: Posey Pronto, PA-C Consultants: Cardiology  Indication for Hospitalization: Syncope  Discharge Diagnoses/Problem List:    Disposition: Able to be discharged home safely  Discharge Condition: Stable  Discharge Exam:   Physical Exam Constitutional:      General: He is not in acute distress.    Appearance: Normal appearance. He is not toxic-appearing.  HENT:     Head: Normocephalic and atraumatic.  Cardiovascular:     Rate and Rhythm: Normal rate and regular rhythm.     Pulses: Normal pulses.  Pulmonary:     Effort: Pulmonary effort is normal.     Breath sounds: Normal breath sounds.  Skin:    General: Skin is warm.  Neurological:     General: No focal deficit present.     Mental Status: He is alert.     Brief Hospital Course:  Cory Ramos is a 75 y.o. male who presented with syncope. PMH is significant for prostate cancer, GERD, hypertension, noninsulin-dependent DM 2, anemia, Alzheimer's disease.  Syncope Concern for Cardiac cause given patient's Bradycardia and Chest/Abdominal pain.  Also concern for patient's many medications that have potential to promote syncopal events.  Trops found to be negative.  TSH in normal range.   Patient was seen by Cardiology, who monitored telemetry and did not observe any abnormal rthyms.  ECHO obtained and showed preserved LV function and no significant valvular abnormalities. Did not have repeat syncopal episode while hospitalized.  Was seen by PT and able to be discharged home with wife safely. Patient was discharged with Cards follow-up scheduled after finishing Isolation for 30 day monitor.  Instructed to avoid driving for next 6  months.    Hypertension Patient's home medications of Amlodipine/olmesartan 5-20 daily, Lasix 20 twice daily initially held.  Pateint had SBP's ranging from 150's-190's while hospitalized.  Restarted on Amlodipine/olmesartan 5-20 daily and Lasix decreased to 20 mg daily.    CKD Stage 4 Initially found Creatinine of 2.5 on admission and GFR of 26.  Last Cr from 3 years ago was 1.25, so unsure initally if AKI or progression of CKD.  On repeat labs was found to have stable Cr and thought likely to progression of CKD.  Type 2 DM Patient CBG's remained stable throughout hospitalization.  A1C was 5.9.  Patient previously taking Glipizide 5 daily, pioglitazone 30 daily.  These medications were held while hospitalized and not restarted at discharge.    Alzheimer's Patient was continued on home medications of Donepezil 20 mg daily, Memantine 20 mg daily.  Spoke with patient's wife regarding risks and benefits of Donepezil and wife indicated she did not want Donepezil to be continued at discharge.  COVID-19 infection Patient found to incidentally to be COVID positive on PCR.  Did not have any COVID symptoms of cough, dyspnea, fever, congestion or GI symptoms while hospitalized and did not require any aditional supplemental O2.  Started on 3 day course of Remdesevir with instructions to obtain remaining 2 doses outpatient.      Issues for Follow Up:  1. Several medication adjustments made to avoid repeat episodes as noted above in Hospital Course. 2. Follow-up with 30 day heart monitor via Cardiology.  Also recommend patient to see Nephrologist given progression of kidney disease. 3. Recommend  close monitoring of blood pressure, blood sugars, and kidney function following medication adjustments.  Significant Procedures:   2D Echo  IMPRESSIONS    1. Left ventricular ejection fraction, by estimation, is 65 to 70%. The  left ventricle has normal function. The left ventricle has no regional   wall motion abnormalities. There is mild concentric left ventricular  hypertrophy. Left ventricular diastolic  parameters are consistent with Grade I diastolic dysfunction (impaired  relaxation).  2. Right ventricular systolic function is normal. The right ventricular  size is normal. There is mildly elevated pulmonary artery systolic  pressure.  3. The mitral valve is rheumatic. Trivial mitral valve regurgitation. No  evidence of mitral stenosis.  4. The aortic valve is tricuspid. There is moderate calcification of the  aortic valve. There is moderate thickening of the aortic valve. Aortic  valve regurgitation is not visualized. Mild to moderate aortic valve  sclerosis/calcification is present,  without any evidence of aortic stenosis.  5. There is mild dilatation of the ascending aorta, measuring 38 mm.  6. The inferior vena cava is normal in size with greater than 50%  respiratory variability, suggesting right atrial pressure of 3 mmHg.     Significant Labs and Imaging:  Recent Labs  Lab 08/16/20 1323  WBC 5.8  HGB 11.5*  HCT 36.1*  PLT 101*   Recent Labs  Lab 08/16/20 1323 08/16/20 1954 08/17/20 0956  NA 143  --  139  K 4.0  --  4.6  CL 106  --  103  CO2 26  --  24  GLUCOSE 140*  --  225*  BUN 29*  --  27*  CREATININE 2.50*  --  2.44*  CALCIUM 8.4*  --  8.6*  MG  --  2.0  --    EXAM: PORTABLE CHEST 1 VIEW  COMPARISON:  None.  FINDINGS: The cardiac silhouette appears borderline to mildly enlarged but the apparent size and may be exaggerated by portable technique. The hila and mediastinum are normal. No pneumothorax. No pulmonary nodules or masses. No focal infiltrates.  IMPRESSION: The cardiac silhouette appears borderline to mildly enlarged but the apparent size may be exaggerated by portable technique. No other acute abnormalities.   Electronically Signed   By: Dorise Bullion III M.D   On: 08/16/2020 17:23  Results/Tests Pending at  Time of Discharge: None  Discharge Medications:  Allergies as of 08/17/2020   No Known Allergies     Medication List    STOP taking these medications   ALKA-SELTZER PLUS COLD PO   diclofenac 75 MG EC tablet Commonly known as: VOLTAREN   donepezil 10 MG tablet Commonly known as: ARICEPT   glipiZIDE 5 MG 24 hr tablet Commonly known as: GLUCOTROL XL   pioglitazone 30 MG tablet Commonly known as: ACTOS   tiZANidine 4 MG tablet Commonly known as: ZANAFLEX     TAKE these medications   acetaminophen 325 MG tablet Commonly known as: TYLENOL Take 650 mg by mouth every 6 (six) hours as needed for headache (pain).   amLODipine-olmesartan 5-20 MG tablet Commonly known as: AZOR Take 1 tablet by mouth every morning.   aspirin EC 81 MG tablet Take 81 mg by mouth every morning.   carboxymethylcellulose 1 % ophthalmic solution Place 1 drop into both eyes at bedtime. Refresh   colesevelam 625 MG tablet Commonly known as: WELCHOL Take 1,250 mg by mouth See admin instructions. Take 2 tablets (1250 mg) by mouth twice daily - 7am and 3pm  ezetimibe 10 MG tablet Commonly known as: ZETIA Take 10 mg by mouth every morning.   furosemide 40 MG tablet Commonly known as: LASIX Take 0.5 tablets (20 mg total) by mouth daily. What changed: when to take this   Magnesium 500 MG Tabs Take 500 mg by mouth daily as needed (constipation).   memantine 10 MG tablet Commonly known as: NAMENDA Take 20 mg by mouth at bedtime. For confusion/memory   pantoprazole 40 MG tablet Commonly known as: PROTONIX Take 40 mg by mouth daily as needed (acid reflux/heartburn).   Systane Balance 0.6 % Soln Generic drug: Propylene Glycol Place 1 drop into both eyes 4 (four) times daily.   tamsulosin 0.4 MG Caps capsule Commonly known as: FLOMAX Take 0.4 mg by mouth at bedtime.   Vitamin D (Ergocalciferol) 1.25 MG (50000 UNIT) Caps capsule Commonly known as: DRISDOL Take 50,000 Units by mouth every  Wednesday.       Discharge Instructions: Please refer to Patient Instructions section of EMR for full details.  Patient was counseled important signs and symptoms that should prompt return to medical care, changes in medications, dietary instructions, activity restrictions, and follow up appointments.   Follow-Up Appointments:   Delora Fuel, MD 08/17/2020, 9:49 PM PGY-1, [place]

## 2020-08-17 NOTE — Hospital Course (Addendum)
Cory Ramos is a 75 y.o. male who presented with syncope. PMH is significant for prostate cancer, GERD, hypertension, noninsulin-dependent DM 2, anemia, Alzheimer's disease.  Syncope Concern for Cardiac cause given patient's Bradycardia and Chest/Abdominal pain.  Also concern for patient's many medications that have potential to promote syncopal events.  Trops found to be negative.  TSH in normal range.   Patient was seen by Cardiology, who monitored telemetry and did not observe any abnormal rthyms.  ECHO obtained and showed preserved LV function and no significant valvular abnormalities. Did not have repeat syncopal episode while hospitalized.  Was seen by PT and able to be discharged home with wife safely. Patient was discharged with Cards follow-up scheduled after finishing Isolation for 30 day monitor.  Instructed to avoid driving for next 6 months.    Hypertension Patient's home medications of Amlodipine/olmesartan 5-20 daily, Lasix 20 twice daily initially held.  Pateint had SBP's ranging from 150's-190's while hospitalized.  Restarted on Amlodipine/olmesartan 5-20 daily and Lasix decreased to 20 mg daily.    CKD Stage 4 Initially found Creatinine of 2.5 on admission and GFR of 26.  Last Cr from 3 years ago was 1.25, so unsure initally if AKI or progression of CKD.  On repeat labs was found to have stable Cr and thought likely to progression of CKD.  Type 2 DM Patient CBG's remained stable throughout hospitalization.  A1C was 5.9.  Patient previously taking Glipizide 5 daily, pioglitazone 30 daily.  These medications were held while hospitalized and not restarted at discharge.    Alzheimer's Patient was continued on home medications of Donepezil 20 mg daily, Memantine 20 mg daily.  Spoke with patient's wife regarding risks and benefits of Donepezil and wife indicated she did not want Donepezil to be continued at discharge.  COVID-19 infection Patient found to incidentally to be COVID  positive on PCR.  Did not have any COVID symptoms of cough, dyspnea, fever, congestion or GI symptoms while hospitalized and did not require any aditional supplemental O2.  Started on 3 day course of Remdesevir with instructions to obtain remaining 2 doses outpatient.

## 2020-08-17 NOTE — ED Notes (Signed)
Dinner Trays Ordered @ [phone].

## 2020-08-17 NOTE — ED Notes (Signed)
Pt assisted with making a phone call to his wife

## 2020-08-17 NOTE — ED Notes (Signed)
ECHO at bedside.

## 2020-08-18 ENCOUNTER — Ambulatory Visit (HOSPITAL_COMMUNITY)
Admit: 2020-08-18 | Discharge: 2020-08-18 | Disposition: A | Discharge status: Home / Self Care | Payer: Medicare Other | Source: Ambulatory Visit | Attending: Pulmonary Disease | Admitting: Pulmonary Disease

## 2020-08-18 DIAGNOSIS — U071 COVID-19: Secondary | ICD-10-CM | POA: Diagnosis present

## 2020-08-18 DIAGNOSIS — J1282 Pneumonia due to coronavirus disease 2019: Secondary | ICD-10-CM | POA: Diagnosis not present

## 2020-08-18 MED ORDER — SODIUM CHLORIDE 0.9 % IV SOLN
100.0000 mg | Freq: Once | INTRAVENOUS | Status: AC
  Administered 2020-08-18: 100 mg via INTRAVENOUS

## 2020-08-18 MED ORDER — ALBUTEROL SULFATE HFA 108 (90 BASE) MCG/ACT IN AERS: 2.0000 | INHALATION_SPRAY | Freq: Once | RESPIRATORY_TRACT | Status: DC | PRN

## 2020-08-18 MED ORDER — FAMOTIDINE IN NACL 20-0.9 MG/50ML-% IV SOLN: 20.0000 mg | Freq: Once | INTRAVENOUS | Status: DC | PRN

## 2020-08-18 MED ORDER — SODIUM CHLORIDE 0.9 % IV SOLN: INTRAVENOUS | Status: DC | PRN

## 2020-08-18 MED ORDER — DIPHENHYDRAMINE HCL 50 MG/ML IJ SOLN: 50.0000 mg | Freq: Once | INTRAMUSCULAR | Status: DC | PRN

## 2020-08-18 MED ORDER — EPINEPHRINE 0.3 MG/0.3ML IJ SOAJ: 0.3000 mg | Freq: Once | INTRAMUSCULAR | Status: DC | PRN

## 2020-08-18 MED ORDER — METHYLPREDNISOLONE SODIUM SUCC 125 MG IJ SOLR: 125.0000 mg | Freq: Once | INTRAMUSCULAR | Status: DC | PRN

## 2020-08-18 NOTE — Discharge Instructions (Signed)
What types of side effects do monoclonal antibody drugs cause?  Common side effects  In general, the more common side effects caused by monoclonal antibody drugs include: . Allergic reactions, such as hives or itching . Flu-like signs and symptoms, including chills, fatigue, fever, and muscle aches and pains . Nausea, vomiting . Diarrhea . Skin rashes . Low blood pressure   The CDC is recommending patients who receive monoclonal antibody treatments wait at least 90 days before being vaccinated.  Currently, there are no data on the safety and efficacy of mRNA COVID-19 vaccines in persons who received monoclonal antibodies or convalescent plasma as part of COVID-19 treatment. Based on the estimated half-life of such therapies as well as evidence suggesting that reinfection is uncommon in the 90 days after initial infection, vaccination should be deferred for at least 90 days, as a precautionary measure until additional information becomes available, to avoid interference of the antibody treatment with vaccine-induced immune responses.  If you have any questions or concerns after the infusion please call the Advanced Practice Provider on call at [phone]. This number is ONLY intended for your use regarding questions or concerns about the infusion post-treatment side-effects.  Please do not provide this number to others for use. For return to work notes please contact your primary care provider.   If someone you know is interested in receiving treatment please have them call the COVID hotline at [phone].   

## 2020-08-18 NOTE — Progress Notes (Signed)
Patient reviewed Fact Sheet for Patients, Parents, and Caregivers for Emergency Use Authorization (EUA) of Remdesivir for the Treatment of Coronavirus. Patient also reviewed and is agreeable to the estimated cost of treatment. Patient is agreeable to proceed.   

## 2020-08-18 NOTE — Progress Notes (Signed)
  Diagnosis: COVID-19  Physician: Dr. Wright  Procedure: [facility] Med: remdesivir infusion - Provided patient with remdesivir fact sheet for patients, parents and caregivers prior to infusion.  Complications: No immediate complications noted.  Discharge: Discharged home   Cory Ramos 08/18/2020  

## 2020-08-19 ENCOUNTER — Ambulatory Visit (HOSPITAL_COMMUNITY)
Admit: 2020-08-19 | Discharge: 2020-08-19 | Disposition: A | Discharge status: Home / Self Care | Payer: Medicare Other | Attending: Pulmonary Disease | Admitting: Pulmonary Disease

## 2020-08-19 DIAGNOSIS — U071 COVID-19: Secondary | ICD-10-CM | POA: Diagnosis not present

## 2020-08-19 DIAGNOSIS — J1282 Pneumonia due to coronavirus disease 2019: Secondary | ICD-10-CM | POA: Insufficient documentation

## 2020-08-19 MED ORDER — EPINEPHRINE 0.3 MG/0.3ML IJ SOAJ: 0.3000 mg | Freq: Once | INTRAMUSCULAR | Status: DC | PRN

## 2020-08-19 MED ORDER — FAMOTIDINE IN NACL 20-0.9 MG/50ML-% IV SOLN: 20.0000 mg | Freq: Once | INTRAVENOUS | Status: DC | PRN

## 2020-08-19 MED ORDER — METHYLPREDNISOLONE SODIUM SUCC 125 MG IJ SOLR: 125.0000 mg | Freq: Once | INTRAMUSCULAR | Status: DC | PRN

## 2020-08-19 MED ORDER — ALBUTEROL SULFATE HFA 108 (90 BASE) MCG/ACT IN AERS: 2.0000 | INHALATION_SPRAY | Freq: Once | RESPIRATORY_TRACT | Status: DC | PRN

## 2020-08-19 MED ORDER — DIPHENHYDRAMINE HCL 50 MG/ML IJ SOLN: 50.0000 mg | Freq: Once | INTRAMUSCULAR | Status: DC | PRN

## 2020-08-19 MED ORDER — SODIUM CHLORIDE 0.9 % IV SOLN: INTRAVENOUS | Status: DC | PRN

## 2020-08-19 MED ORDER — SODIUM CHLORIDE 0.9 % IV SOLN
100.0000 mg | Freq: Once | INTRAVENOUS | Status: AC
  Administered 2020-08-19: 100 mg via INTRAVENOUS

## 2020-08-19 NOTE — Progress Notes (Signed)
Patient reviewed Fact Sheet for Patients, Parents, and Caregivers for Emergency Use Authorization (EUA) of remdesivir for the Treatment of Coronavirus. Patient also reviewed and is agreeable to the estimated cost of treatment. Patient is agreeable to proceed.    

## 2020-08-19 NOTE — Discharge Instructions (Signed)
10 Things You Can Do to Manage Your COVID-19 Symptoms at Home °If you have possible or confirmed COVID-19: °1. Stay home except to get medical care. °2. Monitor your symptoms carefully. If your symptoms get worse, call your healthcare provider immediately. °3. Get rest and stay hydrated. °4. If you have a medical appointment, call the healthcare provider ahead of time and tell them that you have or may have COVID-19. °5. For medical emergencies, call 911 and notify the dispatch personnel that you have or may have COVID-19. °6. Cover your cough and sneezes with a tissue or use the inside of your elbow. °7. Wash your hands often with soap and water for at least 20 seconds or clean your hands with an alcohol-based hand sanitizer that contains at least 60% alcohol. °8. As much as possible, stay in a specific room and away from other people in your home. Also, you should use a separate bathroom, if available. If you need to be around other people in or outside of the home, wear a mask. °9. Avoid sharing personal items with other people in your household, like dishes, towels, and bedding. °10. Clean all surfaces that are touched often, like counters, tabletops, and doorknobs. Use household cleaning sprays or wipes according to the label instructions. °cdc.gov/coronavirus °02/21/2020 °This information is not intended to replace advice given to you by your health care provider. Make sure you discuss any questions you have with your health care provider. °Document Revised: 06/08/2020 Document Reviewed: 06/08/2020 °Elsevier Patient Education © 2021 Elsevier Inc. °If you have any questions or concerns after the infusion please call the Advanced Practice Provider on call at [phone]. This number is ONLY intended for your use regarding questions or concerns about the infusion post-treatment side-effects.  Please do not provide this number to others for use. For return to work notes please contact your primary care provider.   ° °If someone you know is interested in receiving treatment please have them contact their MD for a referral or visit [email or web address] ° ° ° °

## 2020-08-19 NOTE — Progress Notes (Signed)
  Diagnosis: COVID-19  Physician: Dr. Wright  Procedure: [facility]fusion Clinic Med: remdesivir infusion - Provided patient with remdesivir fact sheet for patients, parents and caregivers prior to infusion.  Complications: No immediate complications noted.  Discharge: Discharged home   Cory Ramos 08/19/2020   

## 2020-08-20 ENCOUNTER — Encounter: Payer: Self-pay | Admitting: *Deleted

## 2020-08-20 NOTE — Progress Notes (Signed)
Patient ID: Cory Ramos, male   DOB: [birth year], 75 y.o.   MRN: [id number] Patient enrolled for [employer] to ship a 30 day cardiac event monitor to his home. Letter with instructions mailed to patient.

## 2020-10-20 ENCOUNTER — Emergency Department (HOSPITAL_COMMUNITY): Payer: Medicare Other

## 2020-10-20 ENCOUNTER — Encounter (HOSPITAL_COMMUNITY): Payer: Self-pay | Admitting: Emergency Medicine

## 2020-10-20 ENCOUNTER — Emergency Department (HOSPITAL_COMMUNITY)
Admission: EM | Admit: 2020-10-20 | Discharge: 2020-10-20 | Disposition: A | Discharge status: Home / Self Care | Payer: Medicare Other | Attending: Emergency Medicine | Admitting: Emergency Medicine

## 2020-10-20 ENCOUNTER — Other Ambulatory Visit: Payer: Self-pay

## 2020-10-20 DIAGNOSIS — F0390 Unspecified dementia without behavioral disturbance: Secondary | ICD-10-CM | POA: Insufficient documentation

## 2020-10-20 DIAGNOSIS — Z79899 Other long term (current) drug therapy: Secondary | ICD-10-CM | POA: Insufficient documentation

## 2020-10-20 DIAGNOSIS — E1122 Type 2 diabetes mellitus with diabetic chronic kidney disease: Secondary | ICD-10-CM | POA: Diagnosis not present

## 2020-10-20 DIAGNOSIS — R0789 Other chest pain: Secondary | ICD-10-CM | POA: Diagnosis present

## 2020-10-20 DIAGNOSIS — N189 Chronic kidney disease, unspecified: Secondary | ICD-10-CM | POA: Insufficient documentation

## 2020-10-20 DIAGNOSIS — Z7982 Long term (current) use of aspirin: Secondary | ICD-10-CM | POA: Insufficient documentation

## 2020-10-20 DIAGNOSIS — I129 Hypertensive chronic kidney disease with stage 1 through stage 4 chronic kidney disease, or unspecified chronic kidney disease: Secondary | ICD-10-CM | POA: Diagnosis not present

## 2020-10-20 DIAGNOSIS — Z8546 Personal history of malignant neoplasm of prostate: Secondary | ICD-10-CM | POA: Diagnosis not present

## 2020-10-20 LAB — BASIC METABOLIC PANEL
Anion gap: 7 (ref 5–15)
BUN: 18 mg/dL (ref 8–23)
CO2: 26 mmol/L (ref 22–32)
Calcium: 9.5 mg/dL (ref 8.9–10.3)
Chloride: 105 mmol/L (ref 98–111)
Creatinine, Ser: 1.94 mg/dL — ABNORMAL HIGH (ref 0.61–1.24)
GFR, Estimated: 35 mL/min — ABNORMAL LOW (ref >60)
Glucose, Bld: 112 mg/dL — ABNORMAL HIGH (ref 70–99)
Potassium: 3.8 mmol/L (ref 3.5–5.1)
Sodium: 138 mmol/L (ref 135–145)

## 2020-10-20 LAB — CBC
HCT: 37.2 % — ABNORMAL LOW (ref 39.0–52.0)
Hemoglobin: 12.1 g/dL — ABNORMAL LOW (ref 13.0–17.0)
MCH: 31.3 pg (ref 26.0–34.0)
MCHC: 32.5 g/dL (ref 30.0–36.0)
MCV: 96.4 fL (ref 80.0–100.0)
Platelets: 167 10*3/uL (ref 150–400)
RBC: 3.86 MIL/uL — ABNORMAL LOW (ref 4.22–5.81)
RDW: 12.9 % (ref 11.5–15.5)
WBC: 5.4 10*3/uL (ref 4.0–10.5)
nRBC: 0 % (ref 0.0–0.2)

## 2020-10-20 LAB — TROPONIN I (HIGH SENSITIVITY)
Troponin I (High Sensitivity): 12 ng/L (ref <18)
Troponin I (High Sensitivity): 14 ng/L (ref <18)

## 2020-10-20 NOTE — ED Notes (Signed)
Patient verbalizes understanding of discharge instructions. Opportunity for questioning and answers were provided. Armband removed by staff, pt discharged from ED and ambulated to lobby to return home with significant other.

## 2020-10-20 NOTE — ED Triage Notes (Signed)
C/o intermittent L sided chest pain x 2 days.  Denies any associated symptoms.

## 2020-10-20 NOTE — ED Provider Notes (Signed)
[facility] EMERGENCY DEPARTMENT Provider Note   CSN: [id number] Arrival date & time: 10/20/20  0815     History Chief Complaint  Patient presents with  . Chest Pain    Cory Ramos is a 75 y.o. male.  HPI  History provided by the patient and his wife.   Pt reports chest pain for the past 2-3 days. Left sternal chest pain is described like a "knot" and "burning". Pain is better when he "mashes" on it. His wife gave him pantoprazole last night. He recently ate spaghetti which is one of his triggers but the pt reports the pain was there before he ate spaghetti.  Pain is worse at night when he is lying down. His wife notes yesterday she was ranking leaves in the yard and was sweating profusely. He told his wife about his chest pain yesterday. Pt denies shortness of breath, fever/chills, nausea, vomiting, diarrhea, numbness, tingling or vision changes. He does not smoke. Follows with the [place], [place]      Past Medical History:  Diagnosis Date  . Acute kidney injury ([place]) 12/27/2016  . Alzheimer's disease ([place])   . Anemia 12/27/2016  . Dementia ([place])   . Diabetes mellitus without complication ([place])   . GERD (gastroesophageal reflux disease)   . Hyperlipidemia   . Hypertension   . Kidney disease 12/27/2016   BUN 53 Creat 3.54  . Prostate cancer [facility])     Patient Active Problem List   Diagnosis Date Noted  . Syncope 08/16/2020  . Malignant neoplasm of prostate ([place]) 08/15/2017  . DM (diabetes mellitus), type 2, uncontrolled, with renal complications ([place]) 37/16/9678  . AKI (acute kidney injury) ([place]) 12/27/2016  . Hyperkalemia 12/27/2016  . GERD (gastroesophageal reflux disease) 12/27/2016  . Essential hypertension 12/27/2016  . Anemia 12/27/2016    Past Surgical History:  Procedure Laterality Date  . PROSTATE BIOPSY         Family History  Problem Relation Age of Onset  . Kidney disease Sister   . Cancer Neg Hx     Social History    Tobacco Use  . Smoking status: Never Smoker  . Smokeless tobacco: Never Used  Vaping Use  . Vaping Use: Never used  Substance Use Topics  . Alcohol use: No  . Drug use: No    Home Medications Prior to Admission medications   Medication Sig Start Date End Date Taking? Authorizing Provider  acetaminophen (TYLENOL) 325 MG tablet Take 650 mg by mouth every 6 (six) hours as needed for headache (pain).    [provider]  amLODipine-olmesartan (AZOR) 5-20 MG tablet Take 1 tablet by mouth every morning.    [provider]  aspirin EC 81 MG tablet Take 81 mg by mouth every morning.    [provider]  carboxymethylcellulose 1 % ophthalmic solution Place 1 drop into both eyes at bedtime. Refresh    [provider]  colesevelam (WELCHOL) 625 MG tablet Take 1,250 mg by mouth See admin instructions. Take 2 tablets (1250 mg) by mouth twice daily - 7am and 3pm    [provider]  ezetimibe (ZETIA) 10 MG tablet Take 10 mg by mouth every morning.    [provider]  furosemide (LASIX) 40 MG tablet Take 0.5 tablets (20 mg total) by mouth daily. 08/17/20   Lurline Del, DO  Magnesium 500 MG TABS Take 500 mg by mouth daily as needed (constipation).    [provider]  memantine (NAMENDA) 10 MG  tablet Take 20 mg by mouth at bedtime. For confusion/memory    [provider]  pantoprazole (PROTONIX) 40 MG tablet Take 40 mg by mouth daily as needed (acid reflux/heartburn).    [provider]  Propylene Glycol (SYSTANE BALANCE) 0.6 % SOLN Place 1 drop into both eyes 4 (four) times daily.    [provider]  tamsulosin (FLOMAX) 0.4 MG CAPS capsule Take 0.4 mg by mouth at bedtime.    [provider]  Vitamin D, Ergocalciferol, (DRISDOL) 50000 units CAPS capsule Take 50,000 Units by mouth every Wednesday.    [provider]    Allergies    Patient has no known allergies.  Review of Systems   Review of  Systems  Constitutional: Positive for diaphoresis. Negative for fever.  HENT: Negative for congestion and sore throat.   Eyes: Negative for visual disturbance.  Respiratory: Negative for cough and shortness of breath.   Cardiovascular: Positive for chest pain. Negative for palpitations and leg swelling.  Musculoskeletal: Positive for back pain (low). Negative for neck pain.  Neurological: Negative for dizziness, syncope, weakness, light-headedness, numbness and headaches.  All other systems reviewed and are negative.   Physical Exam Updated Vital Signs BP (!) 166/70 (BP Location: Right Arm)   Pulse 73   Temp 98.6 F (37 C)   Resp 18   SpO2 98%   Physical Exam Vitals and nursing note reviewed.  Constitutional:      General: He is not in acute distress.    Appearance: He is well-developed. He is not ill-appearing.  HENT:     Head: Normocephalic and atraumatic.  Eyes:     Conjunctiva/sclera: Conjunctivae normal.  Neck:     Vascular: No JVD.  Cardiovascular:     Rate and Rhythm: Normal rate and regular rhythm.     Heart sounds: Normal heart sounds. Heart sounds not distant. No murmur heard.   Pulmonary:     Effort: Pulmonary effort is normal. No respiratory distress.     Breath sounds: Normal breath sounds. No wheezing, rhonchi or rales.  Chest:     Chest wall: Tenderness (left sternal ) present. No mass or deformity.  Abdominal:     General: Bowel sounds are normal.     Palpations: Abdomen is soft. There is no mass.     Tenderness: There is no abdominal tenderness. There is no guarding or rebound.  Musculoskeletal:     Cervical back: Normal range of motion and neck supple.     Right lower leg: No tenderness.     Left lower leg: No tenderness.  Skin:    General: Skin is warm and dry.     Capillary Refill: Capillary refill takes less than 2 seconds.  Neurological:     Mental Status: He is alert.     Comments: At baseline (dementia)     ED Results / Procedures /  Treatments   Labs (all labs ordered are listed, but only abnormal results are displayed) Labs Reviewed  BASIC METABOLIC PANEL - Abnormal; Notable for the following components:      Result Value   Glucose, Bld 112 (*)    Creatinine, Ser 1.94 (*)    GFR, Estimated 35 (*)    All other components within normal limits  CBC - Abnormal; Notable for the following components:   RBC 3.86 (*)    Hemoglobin 12.1 (*)    HCT 37.2 (*)    All other components within normal limits  TROPONIN I (HIGH  SENSITIVITY)  TROPONIN I (HIGH SENSITIVITY)    EKG EKG Interpretation  Date/Time:  Tuesday October 20 2020 08:19:04 EDT Ventricular Rate:  77 PR Interval:  150 QRS Duration: 84 QT Interval:  342 QTC Calculation: 387 R Axis:   21 Text Interpretation: Normal sinus rhythm Normal ECG Confirmed by Elnora Morrison [phone]) on 10/20/2020 9:36:36 AM Also confirmed by Elnora Morrison [phone])  on 10/20/2020 9:38:31 AM   Radiology DG Chest 2 View  Result Date: 10/20/2020 CLINICAL DATA:  75 year old male with chest pain EXAM: CHEST - 2 VIEW COMPARISON:  08/16/2020 FINDINGS: Cardiomediastinal silhouette unchanged. No pneumothorax or pleural effusion. No interlobular septal thickening. Coarsened interstitial markings persist. No confluent airspace disease. No displaced fracture with degenerative changes of the spine. IMPRESSION: Negative for acute cardiopulmonary disease Electronically Signed   By: Corrie Mckusick D.O.   On: 10/20/2020 08:39    Procedures Procedures   Medications Ordered in ED Medications - No data to display  ED Course  I have reviewed the triage vital signs and the nursing notes.  Pertinent labs & imaging results that were available during my care of the patient were reviewed by me and considered in my medical decision making (see chart for details).  10:29 AM  Initial trop normal. RN in the room obtaining 2nd troponin. Pt denies current chest pain or burning sensation.   12:20 PM Updated pt  and wife regarding negative troponins. Pt still is pain free. Wife will call [place] cardiologist for an appointment.      MDM Rules/Calculators/A&P                          Pt is a 75 yo male with HTN, T2DM, CKD, HLD, Alzheimer dz, prostate cancer, Vit D deficiency who presents after 3 days of chest pain.CXR without evidence of pneumonia, rib fractures or pneumothorax. History not concerning for aortic dissection. EKG NSR and without acute ST or T wave changes.  Initial troponin normal.   Pt has reproducible chest wall tenderness on exam, suspect MSK related chest wall pain. Trop 12>14. EKG unremarkable. Not likely ACS. Denies burning in ED, not likely GERD. Discussed follow up with cardiologist. Wife to call [place] cardiologist for an appointment. Recommended Tylenol as needed for pain. Return precautions given.      Final Clinical Impression(s) / ED Diagnoses Final diagnoses:  Atypical chest pain    Rx / DC Orders ED Discharge Orders    None       Lyndee Hensen, DO 10/20/20 1223    Elnora Morrison, MD 10/20/20 [phone]

## 2020-10-20 NOTE — Discharge Instructions (Signed)
You were seen at the [facility] Emergency Department for chest pain. Markers for cardiac related chest pain were normal. Your pain is likely due to chest wall muscular pain. Be sure to follow up with a [place] cardiologist in the next 1-2 weeks.  Take Tylenol as needed for pain.   If chest pain returns and is worsening or you have difficulty breathing, return to the ED.   Dr. Rushie [facility] Emergency Department

## 2021-04-14 NOTE — Progress Notes (Signed)
Patient never wore monitor. Order will be cancelled

## 2021-07-20 IMAGING — DX DG CHEST 1V PORT
1 series · 1 of 1 positions shown · non-contrast
Comparison: None.

CLINICAL DATA: Syncope

EXAM:
PORTABLE CHEST 1 VIEW

[chest ap]
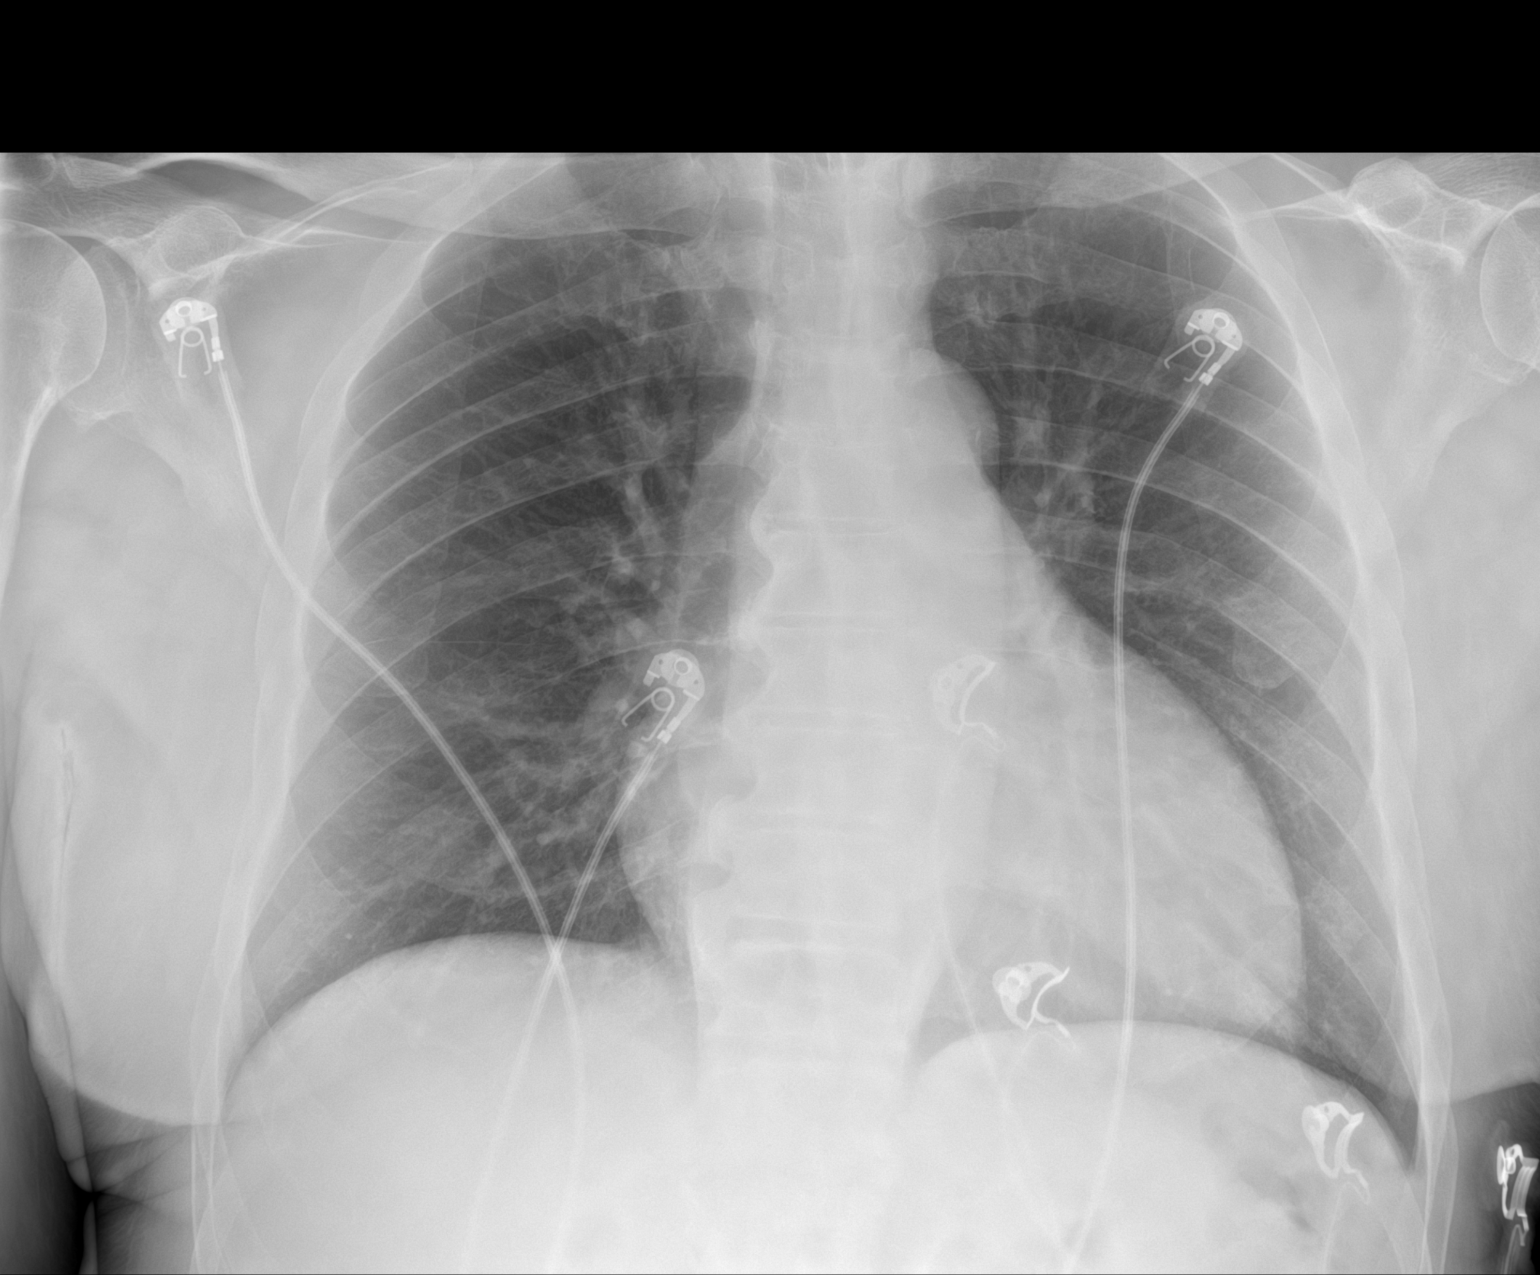

[1 of 1 positions shown; findings below may reference images not displayed]

FINDINGS: The cardiac silhouette appears borderline to mildly enlarged but the
apparent size and may be exaggerated by portable technique. The hila
and mediastinum are normal. No pneumothorax. No pulmonary nodules or
masses. No focal infiltrates.
IMPRESSION: The cardiac silhouette appears borderline to mildly enlarged but the
apparent size may be exaggerated by portable technique. No other
acute abnormalities.

## 2021-09-23 IMAGING — DX DG CHEST 2V
2 series · 2 of 2 positions shown · non-contrast
Comparison: 08/16/2020

CLINICAL DATA: 74-year-old male with chest pain

EXAM:
CHEST - 2 VIEW

[chest pa]
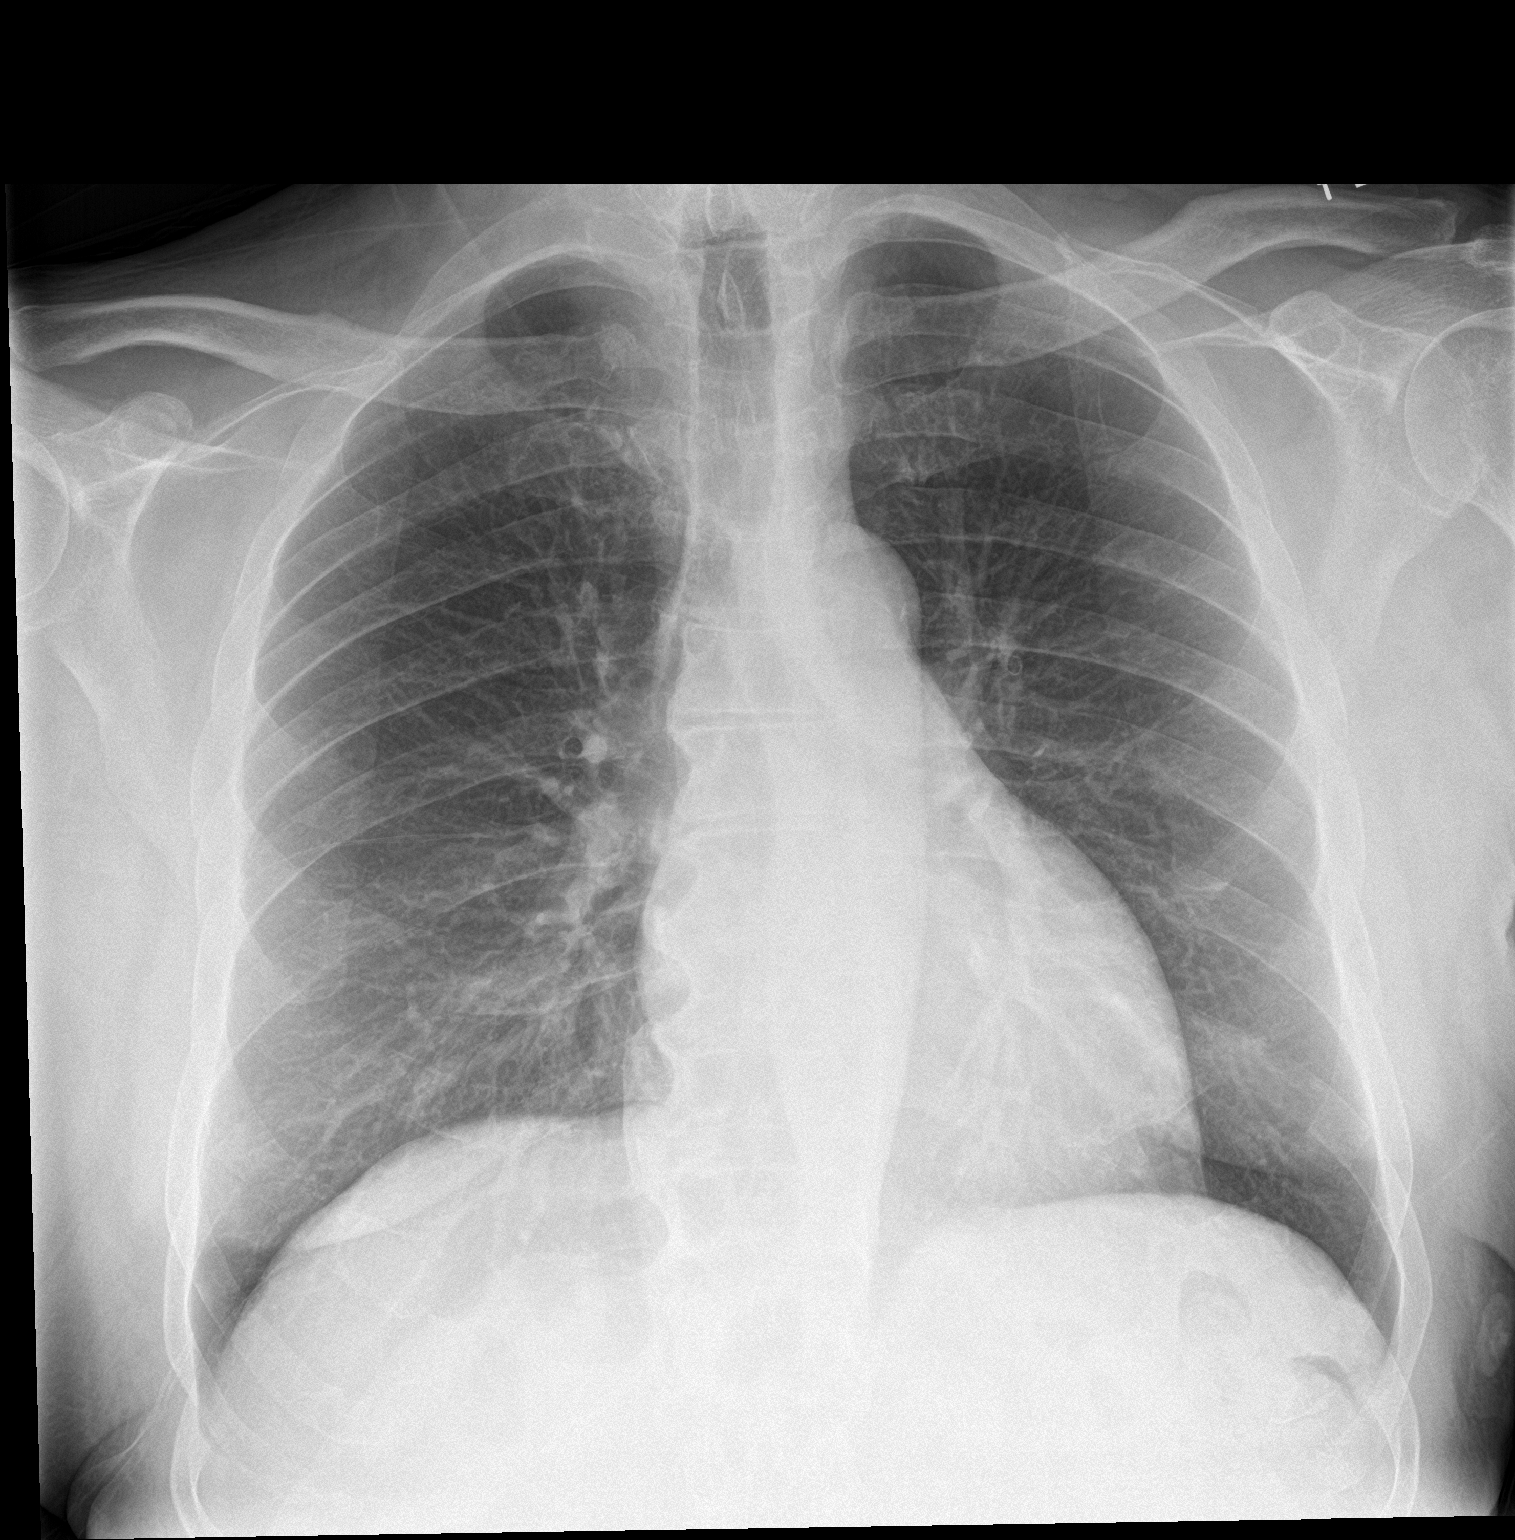

[chest lat]
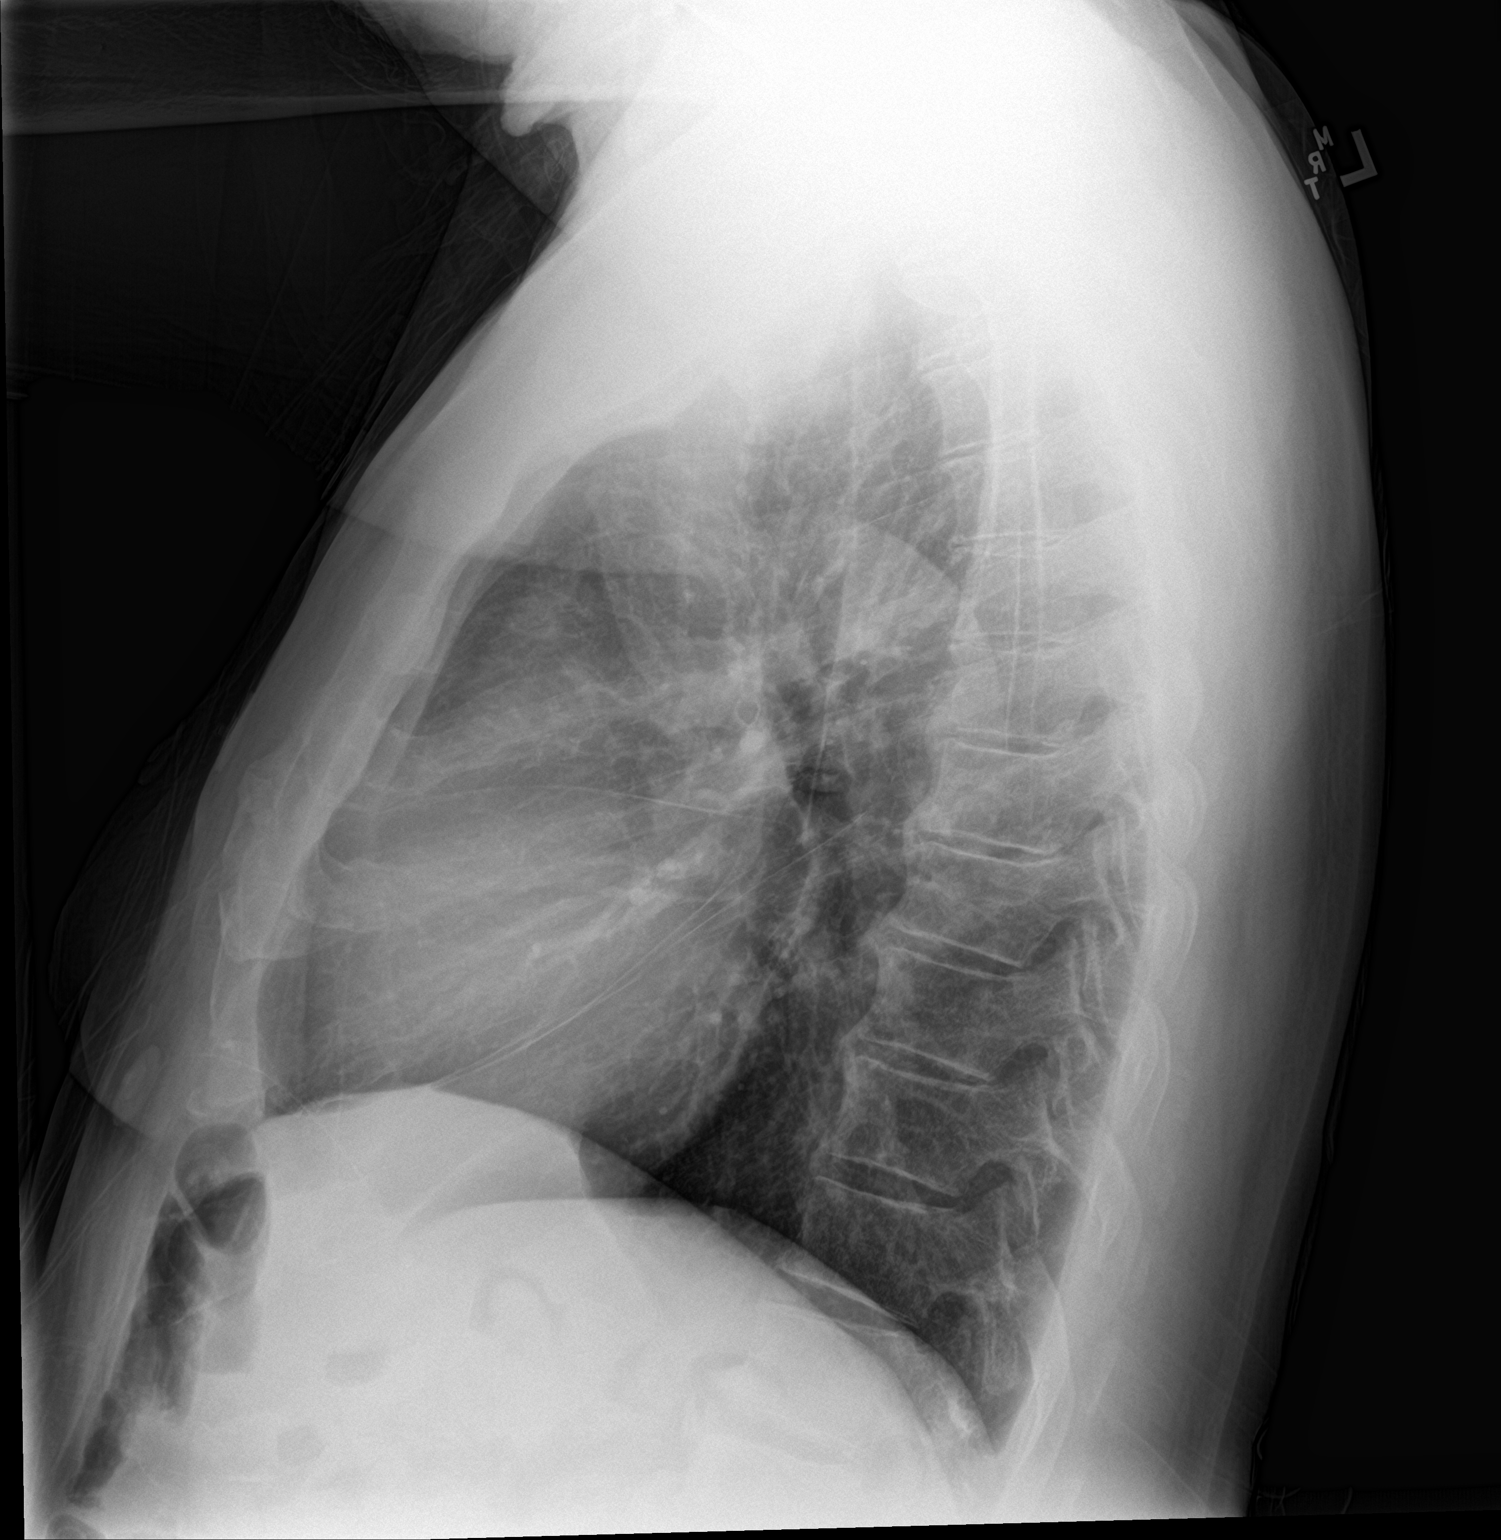

[2 of 2 positions shown; findings below may reference images not displayed]

FINDINGS: Cardiomediastinal silhouette unchanged. No pneumothorax or pleural
effusion. No interlobular septal thickening. Coarsened interstitial
markings persist. No confluent airspace disease. No displaced
fracture with degenerative changes of the spine.
IMPRESSION: Negative for acute cardiopulmonary disease

## 2022-07-15 ENCOUNTER — Other Ambulatory Visit (HOSPITAL_COMMUNITY): Payer: Self-pay | Admitting: Urology

## 2022-07-15 DIAGNOSIS — C61 Malignant neoplasm of prostate: Secondary | ICD-10-CM

## 2022-07-29 ENCOUNTER — Ambulatory Visit (HOSPITAL_COMMUNITY)
Admission: RE | Admit: 2022-07-29 | Discharge: 2022-07-29 | Disposition: A | Discharge status: Home / Self Care | Payer: No Typology Code available for payment source | Source: Ambulatory Visit | Attending: Urology | Admitting: Urology

## 2022-07-29 DIAGNOSIS — C61 Malignant neoplasm of prostate: Secondary | ICD-10-CM | POA: Insufficient documentation

## 2022-07-29 MED ORDER — GADOBUTROL 1 MMOL/ML IV SOLN
10.0000 mL | Freq: Once | INTRAVENOUS | Status: AC | PRN
Start: 2022-07-29 — End: 2022-07-29
  Administered 2022-07-29: 10 mL via INTRAVENOUS

## 2022-08-31 ENCOUNTER — Telehealth: Payer: Self-pay | Admitting: Radiation Oncology

## 2022-08-31 ENCOUNTER — Ambulatory Visit
Admission: RE | Admit: 2022-08-31 | Discharge: 2022-08-31 | Disposition: A | Discharge status: Home / Self Care | Payer: Self-pay | Source: Ambulatory Visit | Attending: Radiation Oncology | Admitting: Radiation Oncology

## 2022-08-31 ENCOUNTER — Other Ambulatory Visit: Payer: Self-pay | Admitting: Radiation Oncology

## 2022-08-31 DIAGNOSIS — C61 Malignant neoplasm of prostate: Secondary | ICD-10-CM

## 2022-08-31 NOTE — Telephone Encounter (Signed)
Unable to leave message for patient to call back to schedule consult per 1/18 referral. Will try again tomorrow (no vm).

## 2022-09-01 ENCOUNTER — Telehealth: Payer: Self-pay | Admitting: Radiation Oncology

## 2022-09-01 NOTE — Telephone Encounter (Signed)
Unable to leave message for patient to call back to schedule consult per 1/18 referral. Will try again later.

## 2022-09-01 NOTE — Telephone Encounter (Signed)
Unable to leave message for patient to call back to schedule consult per 1/18 referral. Will try again tomorrow.

## 2022-09-02 ENCOUNTER — Telehealth: Payer: Self-pay | Admitting: Radiation Oncology

## 2022-09-02 NOTE — Telephone Encounter (Signed)
Unable to leave message for patient to call back to schedule a consult per 1/18 referral. Will try again Monday.

## 2022-09-05 ENCOUNTER — Telehealth: Payer: Self-pay | Admitting: Radiation Oncology

## 2022-09-05 NOTE — Telephone Encounter (Signed)
Unable to leave message for patient to call back to schedule consult per 1/18 referral. Will try again later (vm full).

## 2022-09-06 ENCOUNTER — Telehealth: Payer: Self-pay | Admitting: Radiation Oncology

## 2022-09-06 NOTE — Telephone Encounter (Signed)
Unable to leave message for patient to call back to schedule consult per 1/18 referral. Will send letter for patient to call us to schedule.

## 2022-09-08 NOTE — Progress Notes (Incomplete)
GU Location of Tumor / Histology: Prostate Ca  If Prostate Cancer, Gleason Score is (4 + 4) and PSA is (20.600 on 04/2022).   Biopsies     Past/Anticipated interventions by urology, if any:     Past/Anticipated interventions by medical oncology, if any:  NA  Weight changes, if any: {:18581}  IPPS: SHIM:  Bowel/Bladder complaints, if any: {:18581}   Nausea/Vomiting, if any: {:18581}  Pain issues, if any:  {:18581}  SAFETY ISSUES: Prior radiation? {:18581} Pacemaker/ICD? {:18581} Possible current pregnancy? Male Is the patient on methotrexate? No  Current Complaints / other details:

## 2022-09-09 ENCOUNTER — Other Ambulatory Visit (HOSPITAL_COMMUNITY): Payer: Self-pay | Admitting: Urology

## 2022-09-09 DIAGNOSIS — C61 Malignant neoplasm of prostate: Secondary | ICD-10-CM

## 2022-09-09 NOTE — Progress Notes (Signed)
RN reviewed that PSMA PET was authorized that was ordered by Dr. Lovena Neighbours.  RN was able to schedule patient for imaging on 09/22/2022 @ 3:20pm.    Spoke with patient's wife, and she is aware of appointment time and dates.  Pt and patient wife would prefer to have consult after imaging.  RN provided direct contact number for any additional questions or concerns that may arise.    Scheduling message placed.

## 2022-09-12 ENCOUNTER — Ambulatory Visit: Payer: Non-veteran care

## 2022-09-12 ENCOUNTER — Ambulatory Visit: Payer: Non-veteran care | Admitting: Radiation Oncology

## 2022-09-22 ENCOUNTER — Encounter (HOSPITAL_COMMUNITY)
Admission: RE | Admit: 2022-09-22 | Discharge: 2022-09-22 | Disposition: A | Discharge status: Home / Self Care | Payer: No Typology Code available for payment source | Source: Ambulatory Visit | Attending: Urology | Admitting: Urology

## 2022-09-22 DIAGNOSIS — C61 Malignant neoplasm of prostate: Secondary | ICD-10-CM | POA: Diagnosis not present

## 2022-09-22 MED ORDER — PIFLIFOLASTAT F 18 (PYLARIFY) INJECTION
9.0000 | Freq: Once | INTRAVENOUS | Status: AC
  Administered 2022-09-22: 9.68 via INTRAVENOUS

## 2022-09-26 ENCOUNTER — Encounter: Payer: Self-pay | Admitting: Radiation Oncology

## 2022-09-26 ENCOUNTER — Ambulatory Visit
Admission: RE | Admit: 2022-09-26 | Discharge: 2022-09-26 | Disposition: A | Discharge status: Home / Self Care | Payer: No Typology Code available for payment source | Source: Ambulatory Visit | Attending: Radiation Oncology | Admitting: Radiation Oncology

## 2022-09-26 ENCOUNTER — Telehealth: Payer: Self-pay | Admitting: *Deleted

## 2022-09-26 DIAGNOSIS — I10 Essential (primary) hypertension: Secondary | ICD-10-CM | POA: Insufficient documentation

## 2022-09-26 DIAGNOSIS — Z79899 Other long term (current) drug therapy: Secondary | ICD-10-CM | POA: Diagnosis not present

## 2022-09-26 DIAGNOSIS — K219 Gastro-esophageal reflux disease without esophagitis: Secondary | ICD-10-CM | POA: Insufficient documentation

## 2022-09-26 DIAGNOSIS — C61 Malignant neoplasm of prostate: Secondary | ICD-10-CM

## 2022-09-26 DIAGNOSIS — Z7982 Long term (current) use of aspirin: Secondary | ICD-10-CM | POA: Diagnosis not present

## 2022-09-26 DIAGNOSIS — E119 Type 2 diabetes mellitus without complications: Secondary | ICD-10-CM | POA: Diagnosis not present

## 2022-09-26 DIAGNOSIS — E785 Hyperlipidemia, unspecified: Secondary | ICD-10-CM | POA: Insufficient documentation

## 2022-09-26 DIAGNOSIS — F0280 Dementia in other diseases classified elsewhere without behavioral disturbance: Secondary | ICD-10-CM | POA: Insufficient documentation

## 2022-09-26 NOTE — Telephone Encounter (Signed)
CALLED PATIENT TO INFORM OF ADT APPT. ON 10-06-22- ARRIVAL TIME- 9:45 AM @ DR. Jackson Latino OFFICE, SPOKE WITH PATIENT'S WIFE- Cory Ramos AND SHE IS AWARE OF THIS APPT.

## 2022-09-26 NOTE — Progress Notes (Signed)
Radiation Oncology         [phone]) [phone] ________________________________  Initial Outpatient Consultation  Name: Cory Ramos MRN: [id number]  Date: 09/26/2022  DOB: 1945-11-11  [id number], Cory Rocher, PA-C  Cory Ramos*   REFERRING PHYSICIAN: Davis Ramos*  DIAGNOSIS: 77 y.o. gentleman with Stage T1c adenocarcinoma of the prostate with Gleason score of 4+4, and PSA of 20.60.    ICD-10-CM   1. Malignant neoplasm of prostate (Cory Ramos)  C61       HISTORY OF PRESENT ILLNESS: Cory Ramos is a 77 y.o. male with a history of Alzheimer's is being seen today regarding his diagnosis of grade 4 prostate cancer. He was initially diagnosed with low risk, 3+3 prostate cancer by Dr. Alyson Ramos in 2018. His PSA was 6.95 and he was placed on active surveillance at that time. He was noted to have an elevated PSA of 20.06 on 05/05/22.  Accordingly, he was referred for evaluation in urology by Dr. Lovena Neighbours. MRI of the prostate on 07/29/22 revealed four PI-RADS category 4 lesions within the prostate gland. The patient proceeded to transrectal ultrasound with 16 biopsies of the prostate on 08/15/22.  Out of 16 core biopsies, 7 were positive.  The maximum Gleason score was 4+4, and this was seen in the right base lateral and the prostate ROI #4. Additionaly, tissue with a Gleason score of 4+3 was seen in the right mid lateral and the prostate ROI #1; tissue with a Gleason score of 3+4 was seen in the right mid and the prostate ROI #2; tissue with a Gleason score of 3+3 in the right apex lateral. PSMA PET on 09/22/22 revealed multifocal intense radiotracer activity within the prostate gland, but no evidence of metastatic lymphadenopathy in the pelvis or periaortic retroperitoneum was visualized. No evidence of visceral or skeletal metastasis was seen.  The patient reviewed the biopsy results with his urologist and he has kindly been referred today for discussion of potential radiation treatment  options.   PREVIOUS RADIATION THERAPY: No  PAST MEDICAL HISTORY:  Past Medical History:  Diagnosis Date   Acute kidney injury ([place]) 12/27/2016   Alzheimer's disease ([place])    Anemia 12/27/2016   Dementia ([place])    Diabetes mellitus without complication ([facility])    GERD (gastroesophageal reflux disease)    Hyperlipidemia    Hypertension    Kidney disease 12/27/2016   BUN 53 Creat 3.54   Prostate cancer ([place])       PAST SURGICAL HISTORY: Past Surgical History:  Procedure Laterality Date   PROSTATE BIOPSY      FAMILY HISTORY:  Family History  Problem Relation Age of Onset   Kidney disease Sister    Cancer Neg Hx     SOCIAL HISTORY:  Social History   Socioeconomic History   Marital status: Single    Spouse name: Not on file   Number of children: Not on file   Years of education: Not on file   Highest education level: Not on file  Occupational History   Occupation: retired  Tobacco Use   Smoking status: Never   Smokeless tobacco: Never  Vaping Use   Vaping Use: Never used  Substance and Sexual Activity   Alcohol use: No   Drug use: No   Sexual activity: Never  Other Topics Concern   Not on file  Social History Narrative   Married.    Lives in [place].   Has 2 adult children. 5 grandchildren.   Retired Art gallery manager.   Social Determinants  of Health   Financial Resource Strain: Not on file  Food Insecurity: No Food Insecurity (09/26/2022)   Hunger Vital Sign    Worried About Running Out of Food in the Last Year: Never true    Ran Out of Food in the Last Year: Never true  Transportation Needs: No Transportation Needs (09/26/2022)   PRAPARE - Hydrologist (Medical): No    Lack of Transportation (Non-Medical): No  Physical Activity: Not on file  Stress: Not on file  Social Connections: Not on file  Intimate Partner Violence: Not At Risk (09/26/2022)   Humiliation, Afraid, Rape, and Kick questionnaire    Fear of Current  or Ex-Partner: No    Emotionally Abused: No    Physically Abused: No    Sexually Abused: No    ALLERGIES: Atorvastatin and Simvastatin  MEDICATIONS:  Current Outpatient Medications  Medication Sig Dispense Refill   capsaicin (ZOSTRIX) 0.025 % cream APPLY SMALL AMOUNT TO AFFECTED AREA AS DIRECTED BY YOUR PROVIDER     fenofibrate (TRICOR) 145 MG tablet Take 1 tablet by mouth daily.     ferrous sulfate 325 (65 FE) MG EC tablet Take 1 tablet by mouth daily.     galantamine (RAZADYNE ER) 24 MG 24 hr capsule TAKE ONE CAPSULE BY MOUTH ONCE DAILY TO SLOW MEMORY LOSS; REPLACES DONEPEZIL. NOT A DOSE CHANGE     glucose blood test strip USE 1 STRIP FOR TESTING BLOOD SUGAR CHECKS AS DIRECTED     ketotifen (ZADITOR) 0.025 % ophthalmic solution INSTILL 1 DROP IN BOTH EYES TWICE A DAY FOR ALLERGIES     lidocaine (LIDODERM) 5 % APPLY 2 PATCHES TO SKIN ONCE DAILY (APPLY FOR 12 HOURS, THEN REMOVE FOR 12 HOURS)     loratadine (CLARITIN) 10 MG tablet TAKE ONE TABLET BY MOUTH EVERY MORNING - TAKE BY MOUTH ONCE A DAY FOR 10 DAYS THEN TAKE 1 TABLET ONCE A DAY AS NEEDED FOR ALLERGIES     Multiple Vitamin (MULTIVITAMINS PO) Take 1 tablet by mouth daily.     Psyllium 27 % POWD TAKE 1 TABLESPOONFUL BY MOUTH ONCE A DAY (MIX IN 8 OUNCES OF WATER OR JUICE AND DRINK)     acetaminophen (TYLENOL) 325 MG tablet Take 650 mg by mouth every 6 (six) hours as needed for headache (pain).     amLODipine-olmesartan (AZOR) 5-20 MG tablet Take 1 tablet by mouth every morning.     aspirin EC 81 MG tablet Take 81 mg by mouth every morning.     carboxymethylcellulose 1 % ophthalmic solution Place 1 drop into both eyes at bedtime. Refresh     colesevelam (WELCHOL) 625 MG tablet Take 1,250 mg by mouth See admin instructions. Take 2 tablets (1250 mg) by mouth twice daily - 7am and 3pm     ezetimibe (ZETIA) 10 MG tablet Take 10 mg by mouth every morning.     furosemide (LASIX) 40 MG tablet Take 0.5 tablets (20 mg total) by mouth daily. 30  tablet 0   Magnesium 500 MG TABS Take 500 mg by mouth daily as needed (constipation).     memantine (NAMENDA) 10 MG tablet Take 20 mg by mouth at bedtime. For confusion/memory     pantoprazole (PROTONIX) 40 MG tablet Take 40 mg by mouth daily as needed (acid reflux/heartburn).     Propylene Glycol (SYSTANE BALANCE) 0.6 % SOLN Place 1 drop into both eyes 4 (four) times daily.     tamsulosin (FLOMAX) 0.4 MG CAPS  capsule Take 0.4 mg by mouth at bedtime.     Vitamin D, Ergocalciferol, (DRISDOL) 50000 units CAPS capsule Take 50,000 Units by mouth every Wednesday.     No current facility-administered medications for this encounter.    REVIEW OF SYSTEMS:  On review of systems, the patient reports that he is doing well overall. He denies any chest pain, shortness of breath, cough, fevers, chills, of night sweats. He denies abdominal pain, nausea or vomiting. He denies any new musculoskeletal or joint aches or pains. His IPSS was 6, indicating mild urinary symptoms.  His SHIM was 6, indicating he has severe erectile dysfunction. A complete review of systems is obtained and is otherwise negative.   PHYSICAL EXAM:  Wt Readings from Last 3 Encounters:  08/14/17 238 lb 6.4 oz (108.1 kg)  05/03/17 227 lb (103 kg)  12/27/16 234 lb 11.2 oz (106.5 kg)   Temp Readings from Last 3 Encounters:  10/20/20 98.2 F (36.8 C) (Oral)  08/19/20 99.1 F (37.3 C) (Oral)  08/18/20 99 F (37.2 C) (Oral)   BP Readings from Last 3 Encounters:  10/20/20 (!) 166/69  08/19/20 132/62  08/18/20 (!) 153/73   Pulse Readings from Last 3 Encounters:  10/20/20 (!) 58  08/19/20 62  08/18/20 68   Pain Assessment Pain Score: 0-No pain/10  In general this is a well appearing gentleman in no acute distress. He's alert and oriented x4 and appropriate throughout the examination. Cardiopulmonary assessment is negative for acute distress, and he exhibits normal effort.    KPS = 90  100 - Normal; no complaints; no evidence of  disease. 90   - Able to carry on normal activity; minor signs or symptoms of disease. 80   - Normal activity with effort; some signs or symptoms of disease. 45   - Cares for self; unable to carry on normal activity or to do active work. 60   - Requires occasional assistance, but is able to care for most of his personal needs. 50   - Requires considerable assistance and frequent medical care. 45   - Disabled; requires special care and assistance. 55   - Severely disabled; hospital admission is indicated although death not imminent. 37   - Very sick; hospital admission necessary; active supportive treatment necessary. 10   - Moribund; fatal processes progressing rapidly. 0     - Dead  Karnofsky DA, Abelmann [place], Craver LS and Burchenal [facility] [phone]) The use of the nitrogen mustards in the palliative treatment of carcinoma: with particular reference to bronchogenic carcinoma Cancer 1 634-56  LABORATORY DATA:  Lab Results  Component Value Date   WBC 5.4 10/20/2020   HGB 12.1 (L) 10/20/2020   HCT 37.2 (L) 10/20/2020   MCV 96.4 10/20/2020   PLT 167 10/20/2020   Lab Results  Component Value Date   NA 138 10/20/2020   K 3.8 10/20/2020   CL 105 10/20/2020   CO2 26 10/20/2020   Lab Results  Component Value Date   ALT 13 (L) 05/03/2017   AST 23 05/03/2017   ALKPHOS 80 05/03/2017   BILITOT 0.6 05/03/2017     RADIOGRAPHY: NM PET (PSMA) SKULL TO MID THIGH  Result Date: 09/23/2022 CLINICAL DATA:  Prostate carcinoma with biochemical recurrence. EXAM: NUCLEAR MEDICINE PET SKULL BASE TO THIGH TECHNIQUE: 9.68 mCi F18 Piflufolastat (Pylarify) was injected intravenously. Full-ring PET imaging was performed from the skull base to thigh after the radiotracer. CT data was obtained and used for attenuation correction and anatomic  localization. COMPARISON:  None Available. FINDINGS: NECK No radiotracer activity in neck lymph nodes. Incidental CT finding: None. CHEST No radiotracer accumulation within  mediastinal or hilar lymph nodes. Incidental CT finding: Small 2 mm nodule in the RIGHT upper lobe (117/4) ABDOMEN/PELVIS Prostate: Intense multifocal radiotracer activity within the LEFT and RIGHT lobe of the prostate gland. Lymph nodes: No abnormal radiotracer accumulation within pelvic or abdominal nodes. Liver: No evidence of liver metastasis. Incidental CT finding: None. SKELETON No focal activity to suggest skeletal metastasis. No sclerotic or lytic lesions on CT imaging. IMPRESSION: 1. Multifocal intense radiotracer activity within the prostate gland consistent with primary prostate adenocarcinoma. 2. No evidence of metastatic lymphadenopathy in the pelvis or periaortic retroperitoneum. 3. No visceral metastasis or skeletal metastasis. 4. Small RIGHT upper lobe pulmonary nodule is favored benign Electronically Signed   By: Suzy Bouchard M.D.   On: 09/23/2022 12:31      IMPRESSION/PLAN: 1. 77 y.o. gentleman with Stage T1c adenocarcinoma of the prostate with Gleason Score of 4+4, and PSA of 20.6. We discussed the patient's workup and outlined the nature of prostate cancer in this setting. The patient's T stage, Gleason's score, and PSA put him into the high risk group. Accordingly, he is eligible for ADT in combination with 8 weeks of external radiation. We discussed the available radiation techniques, and focused on the details and logistics of delivery. We discussed and outlined the risks, benefits, short and long-term effects associated with radiotherapy and compared and contrasted these with prostatectomy. We discussed the role of SpaceOAR gel in reducing the rectal toxicity associated with radiotherapy. We also detailed the role of ADT in the treatment of high risk prostate cancer and outlined the associated side effects that could be expected with this therapy.  He appears to have a good understanding of his disease and our treatment recommendations which are of curative intent.  He was encouraged  to ask questions that were answered to his stated satisfaction.  At the conclusion of our conversation, the patient is interested in moving forward with external radiation therapy. Patient will follow up with Dr. Jackson Latino office to begin ADT. We will plan on seeing him 2 months after initiation of ADT for CT simulation.   We personally spent 60 minutes in this encounter including chart review, reviewing radiological studies, meeting face-to-face with the patient, entering orders and completing documentation.    Leona Singleton, PA-C    Tyler Pita, MD  [facility] Direct Dial: [phone]  Fax: [phone] [email or web address]  Skype  LinkedIn

## 2022-09-26 NOTE — Progress Notes (Signed)
Introduced myself to the patient, and his wife, as the prostate nurse navigator.  No barriers to care identified at this time.  He is here to discuss his radiation treatment options and will proceed with ADT followed by radiation.  Patient is scheduled to see Dr. Lovena Neighbours on 2/29 with anticipation of starting ADT.   I gave him my business card and asked him to call me with questions or concerns.  Verbalized understanding.

## 2022-09-26 NOTE — Progress Notes (Signed)
GU Location of Tumor / Histology: Prostate Ca  If Prostate Cancer, Gleason Score is (4 + 4) and PSA is (20.60 on 05/2022)    Biopsies       Past/Anticipated interventions by urology, if any: NA  Past/Anticipated interventions by medical oncology, if any: NA  Weight changes, if any: Yes, 8 lbs. after biopsy.  IPSS:  6 SHIM:  6  Bowel/Bladder complaints, if any:  Rectal bleeding after biopsy may have nicked something per wife.  No blood transfusion needed was put on iron tablets.  Denies bladder issues.  Nausea/Vomiting, if any: No  Pain issues, if any:  0/10  SAFETY ISSUES: Prior radiation? No Pacemaker/ICD? No Possible current pregnancy? No Is the patient on methotrexate? No  Current Complaints / other details:

## 2022-10-06 NOTE — Progress Notes (Signed)
Pt received Eligard '45mg'$  today @ [facility].    Pending fiducial's, spaceOAR, and CT simulation at this time.    Plan of care in progress.

## 2022-10-13 ENCOUNTER — Other Ambulatory Visit: Payer: Self-pay | Admitting: Urology

## 2022-10-13 MED ORDER — FLEET ENEMA 7-19 GM/118ML RE ENEM: 1.0000 | ENEMA | Freq: Once | RECTAL | Status: AC

## 2022-10-18 NOTE — Progress Notes (Signed)
RN spoke with patient's wife to confirm upcoming appointments and plan of care.   Mailed calendar with appointments.   No additional needs at this time.   Plan of care in progress.

## 2022-10-24 ENCOUNTER — Other Ambulatory Visit: Payer: Self-pay | Admitting: Urology

## 2022-11-01 ENCOUNTER — Other Ambulatory Visit (HOSPITAL_COMMUNITY): Payer: Self-pay | Admitting: Urology

## 2022-11-01 DIAGNOSIS — C61 Malignant neoplasm of prostate: Secondary | ICD-10-CM

## 2022-11-08 ENCOUNTER — Encounter (HOSPITAL_BASED_OUTPATIENT_CLINIC_OR_DEPARTMENT_OTHER): Payer: Self-pay | Admitting: Urology

## 2022-11-08 NOTE — Progress Notes (Signed)
Spoke w/ via phone for pre-op interview--- [place]----    ISTAT and EKG per anesthesia           Lab results------ COVID test -----patient states asymptomatic no test needed Arrive at -------0600 NPO after MN NO Solid Food.   Med rec completed Medications to take morning of surgery ----- eye gtts and PRotonix Diabetic medication ----- Patient instructed no nail polish to be worn day of surgery Patient instructed to bring photo id and insurance card day of surgery Patient aware to have Driver (ride ) / caregiver Marshallia-wife   for 24 hours after surgery  Patient Special Instructions ----- Pre-Op special Istructions ----- FLEETS enema night before procedure. Patient verbalized understanding of instructions that were given at this phone interview. Patient denies shortness of breath, chest pain, fever, cough at this phone interview.  Patient to stop ASA 5 days prior to surgery.

## 2022-11-15 ENCOUNTER — Ambulatory Visit (HOSPITAL_BASED_OUTPATIENT_CLINIC_OR_DEPARTMENT_OTHER): Payer: No Typology Code available for payment source | Admitting: Anesthesiology

## 2022-11-15 ENCOUNTER — Other Ambulatory Visit: Payer: Self-pay

## 2022-11-15 ENCOUNTER — Encounter (HOSPITAL_BASED_OUTPATIENT_CLINIC_OR_DEPARTMENT_OTHER)
Admission: RE | Disposition: A | Discharge status: Home / Self Care | Payer: Self-pay | Source: Home / Self Care | Attending: Urology

## 2022-11-15 ENCOUNTER — Encounter (HOSPITAL_BASED_OUTPATIENT_CLINIC_OR_DEPARTMENT_OTHER): Payer: Self-pay | Admitting: Urology

## 2022-11-15 ENCOUNTER — Ambulatory Visit (HOSPITAL_BASED_OUTPATIENT_CLINIC_OR_DEPARTMENT_OTHER)
Admission: RE | Admit: 2022-11-15 | Discharge: 2022-11-15 | Disposition: A | Discharge status: Home / Self Care | Payer: No Typology Code available for payment source | Attending: Urology | Admitting: Urology

## 2022-11-15 ENCOUNTER — Ambulatory Visit (HOSPITAL_COMMUNITY): Payer: No Typology Code available for payment source

## 2022-11-15 ENCOUNTER — Telehealth: Payer: Self-pay | Admitting: *Deleted

## 2022-11-15 DIAGNOSIS — Z79899 Other long term (current) drug therapy: Secondary | ICD-10-CM | POA: Insufficient documentation

## 2022-11-15 DIAGNOSIS — D630 Anemia in neoplastic disease: Secondary | ICD-10-CM | POA: Diagnosis not present

## 2022-11-15 DIAGNOSIS — E119 Type 2 diabetes mellitus without complications: Secondary | ICD-10-CM | POA: Diagnosis not present

## 2022-11-15 DIAGNOSIS — F0280 Dementia in other diseases classified elsewhere without behavioral disturbance: Secondary | ICD-10-CM | POA: Insufficient documentation

## 2022-11-15 DIAGNOSIS — K219 Gastro-esophageal reflux disease without esophagitis: Secondary | ICD-10-CM | POA: Diagnosis not present

## 2022-11-15 DIAGNOSIS — Z7984 Long term (current) use of oral hypoglycemic drugs: Secondary | ICD-10-CM | POA: Diagnosis not present

## 2022-11-15 DIAGNOSIS — C61 Malignant neoplasm of prostate: Secondary | ICD-10-CM

## 2022-11-15 DIAGNOSIS — E785 Hyperlipidemia, unspecified: Secondary | ICD-10-CM | POA: Insufficient documentation

## 2022-11-15 DIAGNOSIS — I10 Essential (primary) hypertension: Secondary | ICD-10-CM | POA: Diagnosis not present

## 2022-11-15 DIAGNOSIS — G309 Alzheimer's disease, unspecified: Secondary | ICD-10-CM | POA: Insufficient documentation

## 2022-11-15 HISTORY — PX: SPACE OAR INSTILLATION: SHX6769

## 2022-11-15 HISTORY — PX: GOLD SEED IMPLANT: SHX6343

## 2022-11-15 LAB — POCT I-STAT, CHEM 8
BUN: 27 mg/dL — ABNORMAL HIGH (ref 8–23)
Calcium, Ion: 0.88 mmol/L — CL (ref 1.15–1.40)
Chloride: 110 mmol/L (ref 98–111)
Creatinine, Ser: 1.9 mg/dL — ABNORMAL HIGH (ref 0.61–1.24)
Glucose, Bld: 117 mg/dL — ABNORMAL HIGH (ref 70–99)
HCT: 34 % — ABNORMAL LOW (ref 39.0–52.0)
Hemoglobin: 11.6 g/dL — ABNORMAL LOW (ref 13.0–17.0)
Potassium: 4.3 mmol/L (ref 3.5–5.1)
Sodium: 141 mmol/L (ref 135–145)
TCO2: 27 mmol/L (ref 22–32)

## 2022-11-15 LAB — GLUCOSE, CAPILLARY: Glucose-Capillary: 127 mg/dL — ABNORMAL HIGH (ref 70–99)

## 2022-11-15 SURGERY — INSERTION, GOLD SEEDS
Anesthesia: Monitor Anesthesia Care | Site: Prostate

## 2022-11-15 MED ORDER — PROPOFOL 500 MG/50ML IV EMUL
INTRAVENOUS | Status: AC
  Filled 2022-11-15: qty 50

## 2022-11-15 MED ORDER — LACTATED RINGERS IV SOLN
INTRAVENOUS | Status: DC
  Administered 2022-11-15: 1000 mL via INTRAVENOUS

## 2022-11-15 MED ORDER — FENTANYL CITRATE (PF) 100 MCG/2ML IJ SOLN: 25.0000 ug | INTRAMUSCULAR | Status: DC | PRN

## 2022-11-15 MED ORDER — LIDOCAINE HCL (PF) 1 % IJ SOLN
INTRAMUSCULAR | Status: DC | PRN
  Administered 2022-11-15: 10 mL

## 2022-11-15 MED ORDER — OXYCODONE HCL 5 MG PO TABS: 5.0000 mg | ORAL_TABLET | Freq: Once | ORAL | Status: DC | PRN

## 2022-11-15 MED ORDER — PROPOFOL 10 MG/ML IV BOLUS
INTRAVENOUS | Status: DC | PRN
  Administered 2022-11-15: 10 mg via INTRAVENOUS
  Administered 2022-11-15: 20 mg via INTRAVENOUS
  Administered 2022-11-15: 10 mg via INTRAVENOUS

## 2022-11-15 MED ORDER — CEFAZOLIN SODIUM-DEXTROSE 2-4 GM/100ML-% IV SOLN
2.0000 g | INTRAVENOUS | Status: AC
  Administered 2022-11-15: 2 g via INTRAVENOUS

## 2022-11-15 MED ORDER — ONDANSETRON HCL 4 MG/2ML IJ SOLN
INTRAMUSCULAR | Status: DC | PRN
  Administered 2022-11-15: 4 mg via INTRAVENOUS

## 2022-11-15 MED ORDER — ONDANSETRON HCL 4 MG/2ML IJ SOLN
INTRAMUSCULAR | Status: AC
  Filled 2022-11-15: qty 2

## 2022-11-15 MED ORDER — ONDANSETRON HCL 4 MG/2ML IJ SOLN: 4.0000 mg | Freq: Once | INTRAMUSCULAR | Status: DC | PRN

## 2022-11-15 MED ORDER — SODIUM CHLORIDE (PF) 0.9 % IJ SOLN
INTRAMUSCULAR | Status: DC | PRN
  Administered 2022-11-15: 10 mL

## 2022-11-15 MED ORDER — OXYCODONE HCL 5 MG/5ML PO SOLN: 5.0000 mg | Freq: Once | ORAL | Status: DC | PRN

## 2022-11-15 MED ORDER — PROPOFOL 500 MG/50ML IV EMUL
INTRAVENOUS | Status: DC | PRN
  Administered 2022-11-15: 200 ug/kg/min via INTRAVENOUS

## 2022-11-15 MED ORDER — CEFAZOLIN SODIUM-DEXTROSE 2-4 GM/100ML-% IV SOLN
INTRAVENOUS | Status: AC
  Filled 2022-11-15: qty 100

## 2022-11-15 MED ORDER — FENTANYL CITRATE (PF) 100 MCG/2ML IJ SOLN
INTRAMUSCULAR | Status: DC | PRN
  Administered 2022-11-15: 50 ug via INTRAVENOUS

## 2022-11-15 MED ORDER — FENTANYL CITRATE (PF) 100 MCG/2ML IJ SOLN
INTRAMUSCULAR | Status: AC
  Filled 2022-11-15: qty 2

## 2022-11-15 SURGICAL SUPPLY — 27 items
APL SKNCLS STERI-STRIP NONHPOA (GAUZE/BANDAGES/DRESSINGS)
BENZOIN TINCTURE PRP APPL 2/3 (GAUZE/BANDAGES/DRESSINGS) IMPLANT
BLADE CLIPPER SENSICLIP SURGIC (BLADE) ×1 IMPLANT
CNTNR URN SCR LID CUP LEK RST (MISCELLANEOUS) ×1 IMPLANT
CONT SPEC 4OZ STRL OR WHT (MISCELLANEOUS) ×1
COVER BACK TABLE 60X90IN (DRAPES) ×1 IMPLANT
DRSG TEGADERM 4X4.75 (GAUZE/BANDAGES/DRESSINGS) ×1 IMPLANT
DRSG TEGADERM 8X12 (GAUZE/BANDAGES/DRESSINGS) ×1 IMPLANT
GAUZE SPONGE 4X4 12PLY STRL (GAUZE/BANDAGES/DRESSINGS) ×1 IMPLANT
GLOVE BIO SURGEON STRL SZ7.5 (GLOVE) ×1 IMPLANT
GLOVE ECLIPSE 8.0 STRL XLNG CF (GLOVE) ×1 IMPLANT
IMPL SPACEOAR VUE SYSTEM (Spacer) IMPLANT
IMPLANT SPACEOAR VUE SYSTEM (Spacer) ×1 IMPLANT
KIT TURNOVER CYSTO (KITS) ×1 IMPLANT
MARKER GOLD PRELOAD 1.2X3 (Urological Implant) ×1 IMPLANT
MARKER SKIN DUAL TIP RULER LAB (MISCELLANEOUS) ×1 IMPLANT
NDL SPNL 22GX3.5 QUINCKE BK (NEEDLE) IMPLANT
NEEDLE SPNL 22GX3.5 QUINCKE BK (NEEDLE) ×1 IMPLANT
SEED GOLD PRELOAD 1.2X3 (Urological Implant) ×1 IMPLANT
SHEATH ULTRASOUND LF (SHEATH) IMPLANT
SHEATH ULTRASOUND LTX NONSTRL (SHEATH) IMPLANT
SLEEVE SCD COMPRESS KNEE MED (STOCKING) ×1 IMPLANT
SURGILUBE 2OZ TUBE FLIPTOP (MISCELLANEOUS) ×1 IMPLANT
SYR 10ML LL (SYRINGE) IMPLANT
SYR CONTROL 10ML LL (SYRINGE) ×1 IMPLANT
TOWEL OR 17X24 6PK STRL BLUE (TOWEL DISPOSABLE) ×1 IMPLANT
UNDERPAD 30X36 HEAVY ABSORB (UNDERPADS AND DIAPERS) ×1 IMPLANT

## 2022-11-15 NOTE — Discharge Instructions (Addendum)
You should avoid strenuous activities today but may resume all normal activities tomorrow.  2.   You can take Tylenol as needed for any pain or discomfort.  3.    Follow up with your radiation oncologist for your simulation appointment as scheduled.  If this is not currently scheduled or you do not know the date/time for that appointment, please contact the radiation oncology office to confirm.    Post Anesthesia Home Care Instructions  Activity: Get plenty of rest for the remainder of the day. A responsible individual must stay with you for 24 hours following the procedure.  For the next 24 hours, DO NOT: -Drive a car -Operate machinery -Drink alcoholic beverages -Take any medication unless instructed by your physician -Make any legal decisions or sign important papers.  Meals: Start with liquid foods such as gelatin or soup. Progress to regular foods as tolerated. Avoid greasy, spicy, heavy foods. If nausea and/or vomiting occur, drink only clear liquids until the nausea and/or vomiting subsides. Call your physician if vomiting continues.  Special Instructions/Symptoms: Your throat may feel dry or sore from the anesthesia or the breathing tube placed in your throat during surgery. If this causes discomfort, gargle with warm salt water. The discomfort should disappear within 24 hours.  

## 2022-11-15 NOTE — Anesthesia Procedure Notes (Signed)
Procedure Name: MAC Date/Time: 11/15/2022 8:24 AM  Performed by: Bishop Limbo, CRNAPre-anesthesia Checklist: Patient identified, Emergency Drugs available, Suction available and Patient being monitored Oxygen Delivery Method: Simple face mask Ventilation: Oral airway inserted - appropriate to patient size Airway Equipment and Method: Oral airway Placement Confirmation: positive ETCO2 Dental Injury: Teeth and Oropharynx as per pre-operative assessment

## 2022-11-15 NOTE — Transfer of Care (Signed)
Immediate Anesthesia Transfer of Care Note  Patient: Cory Ramos  Procedure(s) Performed: GOLD SEED IMPLANT (Prostate) SPACE OAR INSTILLATION (Prostate)  Patient Location: PACU  Anesthesia Type:MAC  Level of Consciousness: drowsy, patient cooperative, and responds to stimulation  Airway & Oxygen Therapy: Patient Spontanous Breathing and Patient connected to face mask oxygen  Post-op Assessment: Report given to RN and Post -op Vital signs reviewed and stable  Post vital signs: Reviewed and stable  Last Vitals:  Vitals Value Taken Time  BP 146/76 11/15/22 0902  Temp    Pulse 52 11/15/22 0906  Resp 20 11/15/22 0906  SpO2 100 % 11/15/22 0906  Vitals shown include unvalidated device data.  Last Pain:  Vitals:   11/15/22 0624  TempSrc:   PainSc: 0-No pain      Patients Stated Pain Goal: 6 (11/15/22 7096)  Complications: No notable events documented.

## 2022-11-15 NOTE — H&P (Signed)
H&P  History of Present Illness: Cory Ramos is a 77 y.o. year old M who presents today for placement of fiducial markers and spaceOAR. No acute complaints  Past Medical History:  Diagnosis Date   Acute kidney injury 12/27/2016   Alzheimer's disease    Anemia 12/27/2016   Dementia    Diabetes mellitus without complication    GERD (gastroesophageal reflux disease)    Hyperlipidemia    Hypertension    Kidney disease 12/27/2016   BUN 53 Creat 3.54   Prostate cancer     Past Surgical History:  Procedure Laterality Date   PROSTATE BIOPSY      Home Medications:  Current Meds  Medication Sig   acetaminophen (TYLENOL) 325 MG tablet Take 650 mg by mouth every 6 (six) hours as needed for headache (pain).   amLODipine-olmesartan (AZOR) 5-20 MG tablet Take 1 tablet by mouth every morning.   aspirin EC 81 MG tablet Take 81 mg by mouth every morning.   capsaicin (ZOSTRIX) 0.025 % cream APPLY SMALL AMOUNT TO AFFECTED AREA AS DIRECTED BY YOUR PROVIDER   carboxymethylcellulose 1 % ophthalmic solution Place 1 drop into both eyes at bedtime. Refresh   colesevelam (WELCHOL) 625 MG tablet Take 1,250 mg by mouth See admin instructions. Take 2 tablets (1250 mg) by mouth twice daily - 7am and 3pm   ezetimibe (ZETIA) 10 MG tablet Take 10 mg by mouth every morning.   fenofibrate (TRICOR) 145 MG tablet Take 1 tablet by mouth daily.   ferrous sulfate 325 (65 FE) MG EC tablet Take 1 tablet by mouth daily.   furosemide (LASIX) 40 MG tablet Take 0.5 tablets (20 mg total) by mouth daily.   galantamine (RAZADYNE ER) 24 MG 24 hr capsule TAKE ONE CAPSULE BY MOUTH ONCE DAILY TO SLOW MEMORY LOSS; REPLACES DONEPEZIL. NOT A DOSE CHANGE   glucose blood test strip USE 1 STRIP FOR TESTING BLOOD SUGAR CHECKS AS DIRECTED   ketotifen (ZADITOR) 0.025 % ophthalmic solution INSTILL 1 DROP IN BOTH EYES TWICE A DAY FOR ALLERGIES   lidocaine (LIDODERM) 5 % APPLY 2 PATCHES TO SKIN ONCE DAILY (APPLY FOR 12 HOURS, THEN REMOVE  FOR 12 HOURS)   loratadine (CLARITIN) 10 MG tablet TAKE ONE TABLET BY MOUTH EVERY MORNING - TAKE BY MOUTH ONCE A DAY FOR 10 DAYS THEN TAKE 1 TABLET ONCE A DAY AS NEEDED FOR ALLERGIES   Magnesium 500 MG TABS Take 500 mg by mouth daily as needed (constipation).   memantine (NAMENDA) 10 MG tablet Take 20 mg by mouth at bedtime. For confusion/memory   Multiple Vitamin (MULTIVITAMINS PO) Take 1 tablet by mouth daily.   pantoprazole (PROTONIX) 40 MG tablet Take 40 mg by mouth daily as needed (acid reflux/heartburn).   Propylene Glycol (SYSTANE BALANCE) 0.6 % SOLN Place 1 drop into both eyes 4 (four) times daily.   Psyllium 27 % POWD TAKE 1 TABLESPOONFUL BY MOUTH ONCE A DAY (MIX IN 8 OUNCES OF WATER OR JUICE AND DRINK)   tamsulosin (FLOMAX) 0.4 MG CAPS capsule Take 0.4 mg by mouth at bedtime.   Vitamin D, Ergocalciferol, (DRISDOL) 50000 units CAPS capsule Take 50,000 Units by mouth every Wednesday.    Allergies:  Allergies  Allergen Reactions   Atorvastatin     Other reaction(s): Muscle pain   Simvastatin     Other reaction(s): Muscle pain    Family History  Problem Relation Age of Onset   Kidney disease Sister    Cancer Neg Hx  Social History:  reports that he has never smoked. He has never used smokeless tobacco. He reports that he does not drink alcohol and does not use drugs.  ROS: A complete review of systems was performed.  All systems are negative except for pertinent findings as noted.  Physical Exam:  Vital signs in last 24 hours: Temp:  [97.7 F (36.5 C)] 97.7 F (36.5 C) (04/09 0606) Pulse Rate:  [52] 52 (04/09 0606) Resp:  [16] 16 (04/09 0606) BP: (169)/(74) 169/74 (04/09 0606) SpO2:  [99 %] 99 % (04/09 0606) Weight:  [89.1 kg] 89.1 kg (04/09 0606) Constitutional:  Alert and oriented, No acute distress Cardiovascular: Regular rate and rhythm Respiratory: Normal respiratory effort, Lungs clear bilaterally GI: Abdomen is soft, nontender, nondistended, no abdominal  masses Lymphatic: No lymphadenopathy Neurologic: Grossly intact, no focal deficits Psychiatric: Normal mood and affect   Laboratory Data:  Recent Labs    11/15/22 0648  HGB 11.6*  HCT 34.0*    Recent Labs    11/15/22 0648  NA 141  K 4.3  CL 110  GLUCOSE 117*  BUN 27*  CREATININE 1.90*     Results for orders placed or performed during the hospital encounter of 11/15/22 (from the past 24 hour(s))  I-STAT, chem 8     Status: Abnormal   Collection Time: 11/15/22  6:48 AM  Result Value Ref Range   Sodium 141 135 - 145 mmol/L   Potassium 4.3 3.5 - 5.1 mmol/L   Chloride 110 98 - 111 mmol/L   BUN 27 (H) 8 - 23 mg/dL   Creatinine, Ser 7.78 (H) 0.61 - 1.24 mg/dL   Glucose, Bld 242 (H) 70 - 99 mg/dL   Calcium, Ion 3.53 (LL) 1.15 - 1.40 mmol/L   TCO2 27 22 - 32 mmol/L   Hemoglobin 11.6 (L) 13.0 - 17.0 g/dL   HCT 61.4 (L) 43.1 - 54.0 %   Comment NOTIFIED PHYSICIAN    No results found for this or any previous visit (from the past 240 hour(s)).  Renal Function: Recent Labs    11/15/22 0648  CREATININE 1.90*   Estimated Creatinine Clearance: 34.6 mL/min (A) (by C-G formula based on SCr of 1.9 mg/dL (H)).  Radiologic Imaging: No results found.  Assessment:  Cory Ramos is a 77 y.o. year old M with prostate cancer  Plan:  To OR as planned for fiducial markers and spaceOAR. Procedure and risks reviewed (including but not limited to hematuria, infection, malplacement, inability to safely place markers, urinary retention)  [name], MD 11/15/2022, 7:25 AM  [facility] Pager: [phone]

## 2022-11-15 NOTE — Progress Notes (Signed)
Attempted to call number provided for Ms Fithen - no answer, VM full

## 2022-11-15 NOTE — Anesthesia Postprocedure Evaluation (Signed)
Anesthesia Post Note  Patient: Jarmon Chopp  Procedure(s) Performed: GOLD SEED IMPLANT (Prostate) SPACE OAR INSTILLATION (Prostate)     Patient location during evaluation: PACU Anesthesia Type: MAC Level of consciousness: awake and alert Pain management: pain level controlled Vital Signs Assessment: post-procedure vital signs reviewed and stable Respiratory status: spontaneous breathing, nonlabored ventilation and respiratory function stable Cardiovascular status: stable and blood pressure returned to baseline Postop Assessment: no apparent nausea or vomiting Anesthetic complications: no   No notable events documented.  Last Vitals:  Vitals:   11/15/22 0930 11/15/22 0950  BP: (!) 159/74 (!) 169/80  Pulse: (!) 48 (!) 50  Resp: 16   Temp: 36.4 C   SpO2: 100% 100%    Last Pain:  Vitals:   11/15/22 0915  TempSrc:   PainSc: 0-No pain                 Breya Cass A.

## 2022-11-15 NOTE — Op Note (Signed)
Preoperative diagnosis: Clinically localized adenocarcinoma of the prostate   Postoperative diagnosis: Clinically localized adenocarcinoma of the prostate  Procedure: 1) Placement of fiducial markers into prostate                    2) Insertion of SpaceOAR hydrogel   Surgeon:Luke Estle Sabella. M.D.  Anesthesia: General  EBL: Minimal  Complications: None  Indication: Cory Ramos is a 77 y.o. gentleman with clinically localized prostate cancer. After discussing management options for treatment, he elected to proceed with radiotherapy. He presents today for the above procedures. The potential risks, complications, alternative options, and expected recovery course have been discussed in detail with the patient and he has provided informed consent to proceed.  Description of procedure: The patient was administered preoperative antibiotics, placed in the dorsal lithotomy position, and prepped and draped in the usual sterile fashion. Next, transrectal ultrasonography was utilized to visualize the prostate. Three gold fiducial markers were then placed into the prostate via transperineal needles under ultrasound guidance at the left apex, left base, and right mid gland under direct ultrasound guidance. A site in the midline was then selected on the perineum for placement of an 18 g needle with saline. The needle was advanced above the rectum and below Denonvillier's fascia to the mid gland and confirmed to be in the midline on transverse imaging. One cc of saline was injected confirming appropriate expansion of this space. A total of 5 cc of saline was then injected to open the space further bilaterally. The saline syringe was then removed and the SpaceOAR hydrogel was injected with good distribution bilaterally. He tolerated the procedure well and without complications. He was given a voiding trial prior to discharge from the PACU.

## 2022-11-15 NOTE — Telephone Encounter (Signed)
CALLED PATIENT TO REMIND OF SIM APPT. FOR 11-17-22- ARRIVAL TIME- 8:45 AM @ [facility], INFORMED PATIENT TO ARRIVE WITH A FULL BLADDER, SPOKE WITH PATIENT'S WIFE AND SHE IS AWARE OF THIS APPT. AND THE INSTRUCTIONS

## 2022-11-15 NOTE — Anesthesia Preprocedure Evaluation (Signed)
Anesthesia Evaluation  Patient identified by MRN, date of birth, ID band Patient awake    Reviewed: Allergy & Precautions, NPO status , Patient's Chart, lab work & pertinent test results  Airway Mallampati: II  TM Distance: >3 FB Neck ROM: Full    Dental no notable dental hx.    Pulmonary neg pulmonary ROS   Pulmonary exam normal breath sounds clear to auscultation       Cardiovascular hypertension, Pt. on medications Normal cardiovascular exam Rhythm:Regular Rate:Bradycardia     Neuro/Psych  PSYCHIATRIC DISORDERS     Dementia negative neurological ROS     GI/Hepatic Neg liver ROS,GERD  Medicated,,  Endo/Other  diabetes, Well Controlled, Type 2, Oral Hypoglycemic Agents  Hyperlipidemia  Renal/GU Renal disease   Prostate Ca    Musculoskeletal negative musculoskeletal ROS (+)    Abdominal   Peds  Hematology  (+) Blood dyscrasia, anemia   Anesthesia Other Findings   Reproductive/Obstetrics                              Anesthesia Physical Anesthesia Plan  ASA: 2  Anesthesia Plan: MAC   Post-op Pain Management: Minimal or no pain anticipated   Induction: Intravenous  PONV Risk Score and Plan: 3 and Treatment may vary due to age or medical condition, Propofol infusion and Ondansetron  Airway Management Planned: Natural Airway and Simple Face Mask  Additional Equipment: None  Intra-op Plan:   Post-operative Plan:   Informed Consent: I have reviewed the patients History and Physical, chart, labs and discussed the procedure including the risks, benefits and alternatives for the proposed anesthesia with the patient or authorized representative who has indicated his/her understanding and acceptance.     Dental advisory given  Plan Discussed with: Anesthesiologist and CRNA  Anesthesia Plan Comments:          Anesthesia Quick Evaluation

## 2022-11-16 NOTE — Progress Notes (Signed)
  Radiation Oncology         [phone] ________________________________  Name: Cory Ramos MRN: [id number]  Date: 11/17/2022  DOB: 12/03/1945  SIMULATION AND TREATMENT PLANNING NOTE    ICD-10-CM   1. Malignant neoplasm of prostate  C61       DIAGNOSIS:   77 y.o. gentleman with Stage T1c adenocarcinoma of the prostate with Gleason score of 4+4, and PSA of 20.60.   NARRATIVE:  The patient was brought to the CT Simulation planning suite.  Identity was confirmed.  All relevant records and images related to the planned course of therapy were reviewed.  The patient freely provided informed written consent to proceed with treatment after reviewing the details related to the planned course of therapy. The consent form was witnessed and verified by the simulation staff.  Then, the patient was set-up in a stable reproducible supine position for radiation therapy.  A vacuum lock pillow device was custom fabricated to position his legs in a reproducible immobilized position.  Then, I performed a urethrogram under sterile conditions to identify the prostatic apex.  CT images were obtained.  Surface markings were placed.  The CT images were loaded into the planning software.  Then the prostate target and avoidance structures including the rectum, bladder, bowel and hips were contoured.  Treatment planning then occurred.  The radiation prescription was entered and confirmed.  A total of one complex treatment devices was fabricated. I have requested : Intensity Modulated Radiotherapy (IMRT) is medically necessary for this case for the following reason:  Rectal sparing.Marland Kitchen  PLAN:   The prostate, seminal vesicles, and pelvic lymph nodes will initially be treated to 45 Gy in 25 fractions of 1.8 Gy followed by a boost to the prostate only, to 75 Gy with 15 additional fractions of 2.0 Gy   ________________________________  Artist Pais Kathrynn Running, M.D.

## 2022-11-17 ENCOUNTER — Ambulatory Visit
Admission: RE | Admit: 2022-11-17 | Discharge: 2022-11-17 | Disposition: A | Discharge status: Home / Self Care | Payer: No Typology Code available for payment source | Source: Ambulatory Visit | Attending: Radiation Oncology | Admitting: Radiation Oncology

## 2022-11-17 ENCOUNTER — Encounter (HOSPITAL_BASED_OUTPATIENT_CLINIC_OR_DEPARTMENT_OTHER): Payer: Self-pay | Admitting: Urology

## 2022-11-17 DIAGNOSIS — Z510 Encounter for antineoplastic radiation therapy: Secondary | ICD-10-CM | POA: Diagnosis present

## 2022-11-17 DIAGNOSIS — C61 Malignant neoplasm of prostate: Secondary | ICD-10-CM | POA: Diagnosis present

## 2022-11-22 DIAGNOSIS — Z510 Encounter for antineoplastic radiation therapy: Secondary | ICD-10-CM | POA: Diagnosis not present

## 2022-11-24 NOTE — Progress Notes (Signed)
RN spoke with patient's wife to assess any navigation needs prior to patient starting treatment on 4/25.    Patient wife inquiring about timeline of ADT - RN provided education.    Progress notes faxed to [facility].   Plan of care in progress.

## 2022-12-01 ENCOUNTER — Other Ambulatory Visit: Payer: Self-pay

## 2022-12-01 ENCOUNTER — Ambulatory Visit
Admission: RE | Admit: 2022-12-01 | Discharge: 2022-12-01 | Disposition: A | Discharge status: Home / Self Care | Payer: No Typology Code available for payment source | Source: Ambulatory Visit | Attending: Radiation Oncology | Admitting: Radiation Oncology

## 2022-12-01 DIAGNOSIS — Z510 Encounter for antineoplastic radiation therapy: Secondary | ICD-10-CM | POA: Diagnosis not present

## 2022-12-01 DIAGNOSIS — C61 Malignant neoplasm of prostate: Secondary | ICD-10-CM

## 2022-12-01 LAB — RAD ONC ARIA SESSION SUMMARY
Course Elapsed Days: 0
Plan Fractions Treated to Date: 1
Plan Prescribed Dose Per Fraction: 1.8 Gy
Plan Total Fractions Prescribed: 25
Plan Total Prescribed Dose: 45 Gy
Reference Point Dosage Given to Date: 1.8 Gy
Reference Point Session Dosage Given: 1.8 Gy
Session Number: 1

## 2022-12-02 ENCOUNTER — Other Ambulatory Visit: Payer: Self-pay

## 2022-12-02 ENCOUNTER — Ambulatory Visit
Admission: RE | Admit: 2022-12-02 | Discharge: 2022-12-02 | Disposition: A | Discharge status: Home / Self Care | Payer: No Typology Code available for payment source | Source: Ambulatory Visit | Attending: Radiation Oncology

## 2022-12-02 DIAGNOSIS — Z510 Encounter for antineoplastic radiation therapy: Secondary | ICD-10-CM | POA: Diagnosis not present

## 2022-12-02 LAB — RAD ONC ARIA SESSION SUMMARY
Course Elapsed Days: 1
Plan Fractions Treated to Date: 2
Plan Prescribed Dose Per Fraction: 1.8 Gy
Plan Total Fractions Prescribed: 25
Plan Total Prescribed Dose: 45 Gy
Reference Point Dosage Given to Date: 3.6 Gy
Reference Point Session Dosage Given: 1.8 Gy
Session Number: 2

## 2022-12-05 ENCOUNTER — Ambulatory Visit
Admission: RE | Admit: 2022-12-05 | Discharge: 2022-12-05 | Disposition: A | Discharge status: Home / Self Care | Payer: No Typology Code available for payment source | Source: Ambulatory Visit | Attending: Radiation Oncology | Admitting: Radiation Oncology

## 2022-12-05 ENCOUNTER — Other Ambulatory Visit: Payer: Self-pay

## 2022-12-05 DIAGNOSIS — Z510 Encounter for antineoplastic radiation therapy: Secondary | ICD-10-CM | POA: Diagnosis not present

## 2022-12-05 LAB — RAD ONC ARIA SESSION SUMMARY
Course Elapsed Days: 4
Plan Fractions Treated to Date: 3
Plan Prescribed Dose Per Fraction: 1.8 Gy
Plan Total Fractions Prescribed: 25
Plan Total Prescribed Dose: 45 Gy
Reference Point Dosage Given to Date: 5.4 Gy
Reference Point Session Dosage Given: 1.8 Gy
Session Number: 3

## 2022-12-06 ENCOUNTER — Other Ambulatory Visit: Payer: Self-pay

## 2022-12-06 ENCOUNTER — Ambulatory Visit
Admission: RE | Admit: 2022-12-06 | Discharge: 2022-12-06 | Disposition: A | Discharge status: Home / Self Care | Payer: No Typology Code available for payment source | Source: Ambulatory Visit | Attending: Radiation Oncology

## 2022-12-06 DIAGNOSIS — Z510 Encounter for antineoplastic radiation therapy: Secondary | ICD-10-CM | POA: Diagnosis not present

## 2022-12-06 LAB — RAD ONC ARIA SESSION SUMMARY
Course Elapsed Days: 5
Plan Fractions Treated to Date: 4
Plan Prescribed Dose Per Fraction: 1.8 Gy
Plan Total Fractions Prescribed: 25
Plan Total Prescribed Dose: 45 Gy
Reference Point Dosage Given to Date: 7.2 Gy
Reference Point Session Dosage Given: 1.8 Gy
Session Number: 4

## 2022-12-07 ENCOUNTER — Ambulatory Visit
Admission: RE | Admit: 2022-12-07 | Discharge: 2022-12-07 | Disposition: A | Discharge status: Home / Self Care | Payer: No Typology Code available for payment source | Source: Ambulatory Visit | Attending: Radiation Oncology | Admitting: Radiation Oncology

## 2022-12-07 ENCOUNTER — Other Ambulatory Visit: Payer: Self-pay

## 2022-12-07 DIAGNOSIS — R310 Gross hematuria: Secondary | ICD-10-CM | POA: Diagnosis present

## 2022-12-07 LAB — RAD ONC ARIA SESSION SUMMARY
Course Elapsed Days: 6
Plan Fractions Treated to Date: 5
Plan Prescribed Dose Per Fraction: 1.8 Gy
Plan Total Fractions Prescribed: 25
Plan Total Prescribed Dose: 45 Gy
Reference Point Dosage Given to Date: 9 Gy
Reference Point Session Dosage Given: 1.8 Gy
Session Number: 5

## 2022-12-08 ENCOUNTER — Other Ambulatory Visit: Payer: Self-pay

## 2022-12-08 ENCOUNTER — Ambulatory Visit
Admission: RE | Admit: 2022-12-08 | Discharge: 2022-12-08 | Disposition: A | Discharge status: Home / Self Care | Payer: No Typology Code available for payment source | Source: Ambulatory Visit | Attending: Radiation Oncology | Admitting: Radiation Oncology

## 2022-12-08 ENCOUNTER — Ambulatory Visit: Admission: RE | Admit: 2022-12-08 | Payer: No Typology Code available for payment source | Source: Ambulatory Visit

## 2022-12-08 DIAGNOSIS — R310 Gross hematuria: Secondary | ICD-10-CM | POA: Diagnosis not present

## 2022-12-08 LAB — RAD ONC ARIA SESSION SUMMARY
Course Elapsed Days: 7
Plan Fractions Treated to Date: 6
Plan Prescribed Dose Per Fraction: 1.8 Gy
Plan Total Fractions Prescribed: 25
Plan Total Prescribed Dose: 45 Gy
Reference Point Dosage Given to Date: 10.8 Gy
Reference Point Session Dosage Given: 1.8 Gy
Session Number: 6

## 2022-12-09 ENCOUNTER — Other Ambulatory Visit: Payer: Self-pay

## 2022-12-09 ENCOUNTER — Ambulatory Visit: Payer: No Typology Code available for payment source

## 2022-12-09 ENCOUNTER — Ambulatory Visit
Admission: RE | Admit: 2022-12-09 | Discharge: 2022-12-09 | Disposition: A | Discharge status: Home / Self Care | Payer: No Typology Code available for payment source | Source: Ambulatory Visit | Attending: Radiation Oncology

## 2022-12-09 DIAGNOSIS — R310 Gross hematuria: Secondary | ICD-10-CM | POA: Diagnosis not present

## 2022-12-09 LAB — RAD ONC ARIA SESSION SUMMARY
Course Elapsed Days: 8
Plan Fractions Treated to Date: 7
Plan Prescribed Dose Per Fraction: 1.8 Gy
Plan Total Fractions Prescribed: 25
Plan Total Prescribed Dose: 45 Gy
Reference Point Dosage Given to Date: 12.6 Gy
Reference Point Session Dosage Given: 1.8 Gy
Session Number: 7

## 2022-12-12 ENCOUNTER — Ambulatory Visit
Admission: RE | Admit: 2022-12-12 | Discharge: 2022-12-12 | Disposition: A | Discharge status: Home / Self Care | Payer: No Typology Code available for payment source | Source: Ambulatory Visit | Attending: Radiation Oncology

## 2022-12-12 ENCOUNTER — Other Ambulatory Visit: Payer: Self-pay

## 2022-12-12 DIAGNOSIS — R310 Gross hematuria: Secondary | ICD-10-CM | POA: Diagnosis not present

## 2022-12-12 LAB — RAD ONC ARIA SESSION SUMMARY
Course Elapsed Days: 11
Plan Fractions Treated to Date: 8
Plan Prescribed Dose Per Fraction: 1.8 Gy
Plan Total Fractions Prescribed: 25
Plan Total Prescribed Dose: 45 Gy
Reference Point Dosage Given to Date: 14.4 Gy
Reference Point Session Dosage Given: 1.8 Gy
Session Number: 8

## 2022-12-13 ENCOUNTER — Other Ambulatory Visit: Payer: Self-pay

## 2022-12-13 ENCOUNTER — Ambulatory Visit
Admission: RE | Admit: 2022-12-13 | Discharge: 2022-12-13 | Disposition: A | Discharge status: Home / Self Care | Payer: No Typology Code available for payment source | Source: Ambulatory Visit | Attending: Radiation Oncology | Admitting: Radiation Oncology

## 2022-12-13 DIAGNOSIS — R310 Gross hematuria: Secondary | ICD-10-CM | POA: Diagnosis not present

## 2022-12-13 LAB — RAD ONC ARIA SESSION SUMMARY
Course Elapsed Days: 12
Plan Fractions Treated to Date: 9
Plan Prescribed Dose Per Fraction: 1.8 Gy
Plan Total Fractions Prescribed: 25
Plan Total Prescribed Dose: 45 Gy
Reference Point Dosage Given to Date: 16.2 Gy
Reference Point Session Dosage Given: 1.8 Gy
Session Number: 9

## 2022-12-14 ENCOUNTER — Ambulatory Visit
Admission: RE | Admit: 2022-12-14 | Discharge: 2022-12-14 | Disposition: A | Discharge status: Home / Self Care | Payer: No Typology Code available for payment source | Source: Ambulatory Visit | Attending: Radiation Oncology | Admitting: Radiation Oncology

## 2022-12-14 ENCOUNTER — Other Ambulatory Visit: Payer: Self-pay

## 2022-12-14 DIAGNOSIS — R310 Gross hematuria: Secondary | ICD-10-CM | POA: Diagnosis not present

## 2022-12-14 LAB — RAD ONC ARIA SESSION SUMMARY
Course Elapsed Days: 13
Plan Fractions Treated to Date: 10
Plan Prescribed Dose Per Fraction: 1.8 Gy
Plan Total Fractions Prescribed: 25
Plan Total Prescribed Dose: 45 Gy
Reference Point Dosage Given to Date: 18 Gy
Reference Point Session Dosage Given: 1.8 Gy
Session Number: 10

## 2022-12-15 ENCOUNTER — Other Ambulatory Visit: Payer: Self-pay

## 2022-12-15 ENCOUNTER — Ambulatory Visit
Admission: RE | Admit: 2022-12-15 | Discharge: 2022-12-15 | Disposition: A | Discharge status: Home / Self Care | Payer: No Typology Code available for payment source | Source: Ambulatory Visit | Attending: Radiation Oncology | Admitting: Radiation Oncology

## 2022-12-15 DIAGNOSIS — R310 Gross hematuria: Secondary | ICD-10-CM | POA: Diagnosis not present

## 2022-12-15 LAB — RAD ONC ARIA SESSION SUMMARY
Course Elapsed Days: 14
Plan Fractions Treated to Date: 11
Plan Prescribed Dose Per Fraction: 1.8 Gy
Plan Total Fractions Prescribed: 25
Plan Total Prescribed Dose: 45 Gy
Reference Point Dosage Given to Date: 19.8 Gy
Reference Point Session Dosage Given: 1.8 Gy
Session Number: 11

## 2022-12-16 ENCOUNTER — Ambulatory Visit
Admission: RE | Admit: 2022-12-16 | Discharge: 2022-12-16 | Disposition: A | Discharge status: Home / Self Care | Payer: No Typology Code available for payment source | Source: Ambulatory Visit | Attending: Radiation Oncology | Admitting: Radiation Oncology

## 2022-12-16 ENCOUNTER — Other Ambulatory Visit: Payer: Self-pay

## 2022-12-16 DIAGNOSIS — R310 Gross hematuria: Secondary | ICD-10-CM | POA: Diagnosis not present

## 2022-12-16 LAB — RAD ONC ARIA SESSION SUMMARY
Course Elapsed Days: 15
Plan Fractions Treated to Date: 12
Plan Prescribed Dose Per Fraction: 1.8 Gy
Plan Total Fractions Prescribed: 25
Plan Total Prescribed Dose: 45 Gy
Reference Point Dosage Given to Date: 21.6 Gy
Reference Point Session Dosage Given: 1.8 Gy
Session Number: 12

## 2022-12-19 ENCOUNTER — Ambulatory Visit
Admission: RE | Admit: 2022-12-19 | Discharge: 2022-12-19 | Disposition: A | Discharge status: Home / Self Care | Payer: No Typology Code available for payment source | Source: Ambulatory Visit | Attending: Radiation Oncology | Admitting: Radiation Oncology

## 2022-12-19 ENCOUNTER — Other Ambulatory Visit: Payer: Self-pay

## 2022-12-19 DIAGNOSIS — R310 Gross hematuria: Secondary | ICD-10-CM | POA: Diagnosis not present

## 2022-12-19 LAB — RAD ONC ARIA SESSION SUMMARY
Course Elapsed Days: 18
Plan Fractions Treated to Date: 13
Plan Prescribed Dose Per Fraction: 1.8 Gy
Plan Total Fractions Prescribed: 25
Plan Total Prescribed Dose: 45 Gy
Reference Point Dosage Given to Date: 23.4 Gy
Reference Point Session Dosage Given: 1.8 Gy
Session Number: 13

## 2022-12-20 ENCOUNTER — Other Ambulatory Visit: Payer: Self-pay

## 2022-12-20 ENCOUNTER — Ambulatory Visit
Admission: RE | Admit: 2022-12-20 | Discharge: 2022-12-20 | Disposition: A | Discharge status: Home / Self Care | Payer: No Typology Code available for payment source | Source: Ambulatory Visit | Attending: Radiation Oncology

## 2022-12-20 DIAGNOSIS — R310 Gross hematuria: Secondary | ICD-10-CM | POA: Diagnosis not present

## 2022-12-20 LAB — RAD ONC ARIA SESSION SUMMARY
Course Elapsed Days: 19
Plan Fractions Treated to Date: 14
Plan Prescribed Dose Per Fraction: 1.8 Gy
Plan Total Fractions Prescribed: 25
Plan Total Prescribed Dose: 45 Gy
Reference Point Dosage Given to Date: 25.2 Gy
Reference Point Session Dosage Given: 1.8 Gy
Session Number: 14

## 2022-12-21 ENCOUNTER — Ambulatory Visit
Admission: RE | Admit: 2022-12-21 | Discharge: 2022-12-21 | Disposition: A | Discharge status: Home / Self Care | Payer: No Typology Code available for payment source | Source: Ambulatory Visit | Attending: Radiation Oncology

## 2022-12-21 ENCOUNTER — Other Ambulatory Visit: Payer: Self-pay

## 2022-12-21 DIAGNOSIS — R310 Gross hematuria: Secondary | ICD-10-CM | POA: Diagnosis not present

## 2022-12-21 LAB — RAD ONC ARIA SESSION SUMMARY
Course Elapsed Days: 20
Plan Fractions Treated to Date: 15
Plan Prescribed Dose Per Fraction: 1.8 Gy
Plan Total Fractions Prescribed: 25
Plan Total Prescribed Dose: 45 Gy
Reference Point Dosage Given to Date: 27 Gy
Reference Point Session Dosage Given: 1.8 Gy
Session Number: 15

## 2022-12-22 ENCOUNTER — Other Ambulatory Visit: Payer: Self-pay

## 2022-12-22 ENCOUNTER — Ambulatory Visit
Admission: RE | Admit: 2022-12-22 | Discharge: 2022-12-22 | Disposition: A | Discharge status: Home / Self Care | Payer: No Typology Code available for payment source | Source: Ambulatory Visit | Attending: Radiation Oncology | Admitting: Radiation Oncology

## 2022-12-22 DIAGNOSIS — R310 Gross hematuria: Secondary | ICD-10-CM | POA: Diagnosis not present

## 2022-12-22 LAB — RAD ONC ARIA SESSION SUMMARY
Course Elapsed Days: 21
Plan Fractions Treated to Date: 16
Plan Prescribed Dose Per Fraction: 1.8 Gy
Plan Total Fractions Prescribed: 25
Plan Total Prescribed Dose: 45 Gy
Reference Point Dosage Given to Date: 28.8 Gy
Reference Point Session Dosage Given: 1.8 Gy
Session Number: 16

## 2022-12-23 ENCOUNTER — Other Ambulatory Visit: Payer: Self-pay

## 2022-12-23 ENCOUNTER — Ambulatory Visit: Payer: No Typology Code available for payment source

## 2022-12-23 ENCOUNTER — Ambulatory Visit
Admission: RE | Admit: 2022-12-23 | Discharge: 2022-12-23 | Disposition: A | Discharge status: Home / Self Care | Payer: No Typology Code available for payment source | Source: Ambulatory Visit | Attending: Radiation Oncology | Admitting: Radiation Oncology

## 2022-12-23 ENCOUNTER — Ambulatory Visit
Admission: RE | Admit: 2022-12-23 | Discharge: 2022-12-23 | Disposition: A | Discharge status: Home / Self Care | Payer: No Typology Code available for payment source | Source: Ambulatory Visit | Attending: Radiation Oncology

## 2022-12-23 DIAGNOSIS — R310 Gross hematuria: Secondary | ICD-10-CM | POA: Diagnosis not present

## 2022-12-23 LAB — RAD ONC ARIA SESSION SUMMARY
Course Elapsed Days: 22
Plan Fractions Treated to Date: 17
Plan Prescribed Dose Per Fraction: 1.8 Gy
Plan Total Fractions Prescribed: 25
Plan Total Prescribed Dose: 45 Gy
Reference Point Dosage Given to Date: 30.6 Gy
Reference Point Session Dosage Given: 1.8 Gy
Session Number: 17

## 2022-12-26 ENCOUNTER — Other Ambulatory Visit: Payer: Self-pay

## 2022-12-26 ENCOUNTER — Ambulatory Visit: Payer: No Typology Code available for payment source

## 2022-12-26 ENCOUNTER — Ambulatory Visit
Admission: RE | Admit: 2022-12-26 | Discharge: 2022-12-26 | Disposition: A | Discharge status: Home / Self Care | Payer: No Typology Code available for payment source | Source: Ambulatory Visit | Attending: Radiation Oncology | Admitting: Radiation Oncology

## 2022-12-26 DIAGNOSIS — R310 Gross hematuria: Secondary | ICD-10-CM

## 2022-12-26 LAB — RAD ONC ARIA SESSION SUMMARY
Course Elapsed Days: 25
Plan Fractions Treated to Date: 18
Plan Prescribed Dose Per Fraction: 1.8 Gy
Plan Total Fractions Prescribed: 25
Plan Total Prescribed Dose: 45 Gy
Reference Point Dosage Given to Date: 32.4 Gy
Reference Point Session Dosage Given: 1.8 Gy
Session Number: 18

## 2022-12-26 LAB — URINALYSIS, COMPLETE (UACMP) WITH MICROSCOPIC
Bacteria, UA: NONE SEEN
Bilirubin Urine: NEGATIVE
Glucose, UA: >=500 mg/dL — AB
Ketones, ur: NEGATIVE mg/dL
Leukocytes,Ua: NEGATIVE
Nitrite: NEGATIVE
Protein, ur: NEGATIVE mg/dL
Specific Gravity, Urine: 1.01 (ref 1.005–1.030)
pH: 5 (ref 5.0–8.0)

## 2022-12-27 ENCOUNTER — Ambulatory Visit
Admission: RE | Admit: 2022-12-27 | Discharge: 2022-12-27 | Disposition: A | Discharge status: Home / Self Care | Payer: No Typology Code available for payment source | Source: Ambulatory Visit | Attending: Radiation Oncology

## 2022-12-27 ENCOUNTER — Other Ambulatory Visit: Payer: Self-pay

## 2022-12-27 ENCOUNTER — Telehealth: Payer: Self-pay

## 2022-12-27 DIAGNOSIS — R310 Gross hematuria: Secondary | ICD-10-CM | POA: Diagnosis not present

## 2022-12-27 LAB — RAD ONC ARIA SESSION SUMMARY
Course Elapsed Days: 26
Plan Fractions Treated to Date: 19
Plan Prescribed Dose Per Fraction: 1.8 Gy
Plan Total Fractions Prescribed: 25
Plan Total Prescribed Dose: 45 Gy
Reference Point Dosage Given to Date: 34.2 Gy
Reference Point Session Dosage Given: 1.8 Gy
Session Number: 19

## 2022-12-27 LAB — URINE CULTURE: Culture: 10000 — AB

## 2022-12-27 NOTE — Telephone Encounter (Signed)
Rn was able to get pt wife on the phone and inform her of patient's urinalysis results (clean overall with minimal blood found, no intervention needed). She was grateful for the phone call and had no questions at this time.

## 2022-12-27 NOTE — Telephone Encounter (Signed)
Rn called to inform pt of uranalysis results. Rn got pt voicemail and left message for pt with call back information. Rn will attempt to call back later to confirm receipt of message.

## 2022-12-28 ENCOUNTER — Other Ambulatory Visit: Payer: Self-pay

## 2022-12-28 ENCOUNTER — Telehealth: Payer: Self-pay

## 2022-12-28 ENCOUNTER — Other Ambulatory Visit: Payer: Self-pay | Admitting: Radiation Oncology

## 2022-12-28 ENCOUNTER — Ambulatory Visit
Admission: RE | Admit: 2022-12-28 | Discharge: 2022-12-28 | Disposition: A | Discharge status: Home / Self Care | Payer: No Typology Code available for payment source | Source: Ambulatory Visit | Attending: Radiation Oncology

## 2022-12-28 DIAGNOSIS — R310 Gross hematuria: Secondary | ICD-10-CM | POA: Diagnosis not present

## 2022-12-28 LAB — RAD ONC ARIA SESSION SUMMARY
Course Elapsed Days: 27
Plan Fractions Treated to Date: 20
Plan Prescribed Dose Per Fraction: 1.8 Gy
Plan Total Fractions Prescribed: 25
Plan Total Prescribed Dose: 45 Gy
Reference Point Dosage Given to Date: 36 Gy
Reference Point Session Dosage Given: 1.8 Gy
Session Number: 20

## 2022-12-28 MED ORDER — AMOXICILLIN-POT CLAVULANATE 875-125 MG PO TABS: 1.0000 | ORAL_TABLET | Freq: Two times a day (BID) | ORAL | 0 refills | Status: AC

## 2022-12-28 NOTE — Telephone Encounter (Signed)
Rn left a second message for pt concerning antibiotics being called in by Dr. Kathrynn Running for suspected UTI based on current urine culture. Rn awaiting a call back to verify the pt received message.

## 2022-12-28 NOTE — Telephone Encounter (Signed)
Rn attempted to call patient to inform him that an antibiotic had been called in for him based on his recent urine culture. A detailed voicemail was left for pt and family. Rn will call back later to verify receipt of this voicemail.

## 2022-12-29 ENCOUNTER — Other Ambulatory Visit: Payer: Self-pay

## 2022-12-29 ENCOUNTER — Ambulatory Visit
Admission: RE | Admit: 2022-12-29 | Discharge: 2022-12-29 | Disposition: A | Discharge status: Home / Self Care | Payer: No Typology Code available for payment source | Source: Ambulatory Visit | Attending: Radiation Oncology | Admitting: Radiation Oncology

## 2022-12-29 ENCOUNTER — Telehealth: Payer: Self-pay

## 2022-12-29 DIAGNOSIS — R310 Gross hematuria: Secondary | ICD-10-CM | POA: Diagnosis not present

## 2022-12-29 LAB — RAD ONC ARIA SESSION SUMMARY
Course Elapsed Days: 28
Plan Fractions Treated to Date: 21
Plan Prescribed Dose Per Fraction: 1.8 Gy
Plan Total Fractions Prescribed: 25
Plan Total Prescribed Dose: 45 Gy
Reference Point Dosage Given to Date: 37.8 Gy
Reference Point Session Dosage Given: 1.8 Gy
Session Number: 21

## 2022-12-29 NOTE — Telephone Encounter (Signed)
Rn was able to reach pt wife and ensure that antibiotic was picked up from pharmacy. She stated they picked it up without complication. She stated she had told one of the nurses on Rad onc about picking the medication up as well. Rn did instruct pt/ family that the pt eating yogurt or taking a probiotic would reduce risk of a secondary infection. Pt/ family were grateful for the phone call and advice.

## 2022-12-30 ENCOUNTER — Other Ambulatory Visit: Payer: Self-pay

## 2022-12-30 ENCOUNTER — Ambulatory Visit
Admission: RE | Admit: 2022-12-30 | Discharge: 2022-12-30 | Disposition: A | Discharge status: Home / Self Care | Payer: No Typology Code available for payment source | Source: Ambulatory Visit | Attending: Radiation Oncology

## 2022-12-30 DIAGNOSIS — R310 Gross hematuria: Secondary | ICD-10-CM | POA: Diagnosis not present

## 2022-12-30 LAB — RAD ONC ARIA SESSION SUMMARY
Course Elapsed Days: 29
Plan Fractions Treated to Date: 22
Plan Prescribed Dose Per Fraction: 1.8 Gy
Plan Total Fractions Prescribed: 25
Plan Total Prescribed Dose: 45 Gy
Reference Point Dosage Given to Date: 39.6 Gy
Reference Point Session Dosage Given: 1.8 Gy
Session Number: 22

## 2023-01-03 ENCOUNTER — Other Ambulatory Visit: Payer: Self-pay

## 2023-01-03 ENCOUNTER — Ambulatory Visit
Admission: RE | Admit: 2023-01-03 | Discharge: 2023-01-03 | Disposition: A | Discharge status: Home / Self Care | Payer: No Typology Code available for payment source | Source: Ambulatory Visit | Attending: Radiation Oncology | Admitting: Radiation Oncology

## 2023-01-03 DIAGNOSIS — R310 Gross hematuria: Secondary | ICD-10-CM | POA: Diagnosis not present

## 2023-01-03 LAB — RAD ONC ARIA SESSION SUMMARY
Course Elapsed Days: 33
Plan Fractions Treated to Date: 23
Plan Prescribed Dose Per Fraction: 1.8 Gy
Plan Total Fractions Prescribed: 25
Plan Total Prescribed Dose: 45 Gy
Reference Point Dosage Given to Date: 41.4 Gy
Reference Point Session Dosage Given: 1.8 Gy
Session Number: 23

## 2023-01-04 ENCOUNTER — Ambulatory Visit
Admission: RE | Admit: 2023-01-04 | Discharge: 2023-01-04 | Disposition: A | Discharge status: Home / Self Care | Payer: No Typology Code available for payment source | Source: Ambulatory Visit | Attending: Radiation Oncology | Admitting: Radiation Oncology

## 2023-01-04 ENCOUNTER — Other Ambulatory Visit: Payer: Self-pay

## 2023-01-04 DIAGNOSIS — R310 Gross hematuria: Secondary | ICD-10-CM | POA: Diagnosis not present

## 2023-01-04 LAB — RAD ONC ARIA SESSION SUMMARY
Course Elapsed Days: 34
Plan Fractions Treated to Date: 24
Plan Prescribed Dose Per Fraction: 1.8 Gy
Plan Total Fractions Prescribed: 25
Plan Total Prescribed Dose: 45 Gy
Reference Point Dosage Given to Date: 43.2 Gy
Reference Point Session Dosage Given: 1.8 Gy
Session Number: 24

## 2023-01-05 ENCOUNTER — Ambulatory Visit
Admission: RE | Admit: 2023-01-05 | Discharge: 2023-01-05 | Disposition: A | Discharge status: Home / Self Care | Payer: No Typology Code available for payment source | Source: Ambulatory Visit | Attending: Radiation Oncology | Admitting: Radiation Oncology

## 2023-01-05 ENCOUNTER — Other Ambulatory Visit: Payer: Self-pay

## 2023-01-05 DIAGNOSIS — R310 Gross hematuria: Secondary | ICD-10-CM | POA: Diagnosis not present

## 2023-01-05 LAB — RAD ONC ARIA SESSION SUMMARY
Course Elapsed Days: 35
Plan Fractions Treated to Date: 25
Plan Prescribed Dose Per Fraction: 1.8 Gy
Plan Total Fractions Prescribed: 25
Plan Total Prescribed Dose: 45 Gy
Reference Point Dosage Given to Date: 45 Gy
Reference Point Session Dosage Given: 1.8 Gy
Session Number: 25

## 2023-01-06 ENCOUNTER — Ambulatory Visit
Admission: RE | Admit: 2023-01-06 | Discharge: 2023-01-06 | Disposition: A | Discharge status: Home / Self Care | Payer: No Typology Code available for payment source | Source: Ambulatory Visit | Attending: Radiation Oncology

## 2023-01-06 ENCOUNTER — Ambulatory Visit
Admission: RE | Admit: 2023-01-06 | Discharge: 2023-01-06 | Disposition: A | Discharge status: Home / Self Care | Payer: No Typology Code available for payment source | Source: Ambulatory Visit | Attending: Radiation Oncology | Admitting: Radiation Oncology

## 2023-01-06 ENCOUNTER — Other Ambulatory Visit: Payer: Self-pay

## 2023-01-06 DIAGNOSIS — R310 Gross hematuria: Secondary | ICD-10-CM | POA: Diagnosis not present

## 2023-01-06 LAB — RAD ONC ARIA SESSION SUMMARY
Course Elapsed Days: 36
Plan Fractions Treated to Date: 1
Plan Prescribed Dose Per Fraction: 2 Gy
Plan Total Fractions Prescribed: 15
Plan Total Prescribed Dose: 30 Gy
Reference Point Dosage Given to Date: 2 Gy
Reference Point Session Dosage Given: 2 Gy
Session Number: 26

## 2023-01-09 ENCOUNTER — Other Ambulatory Visit: Payer: Self-pay

## 2023-01-09 ENCOUNTER — Ambulatory Visit
Admission: RE | Admit: 2023-01-09 | Discharge: 2023-01-09 | Disposition: A | Discharge status: Home / Self Care | Payer: No Typology Code available for payment source | Source: Ambulatory Visit | Attending: Radiation Oncology | Admitting: Radiation Oncology

## 2023-01-09 DIAGNOSIS — R310 Gross hematuria: Secondary | ICD-10-CM | POA: Diagnosis present

## 2023-01-09 LAB — RAD ONC ARIA SESSION SUMMARY
Course Elapsed Days: 39
Plan Fractions Treated to Date: 2
Plan Prescribed Dose Per Fraction: 2 Gy
Plan Total Fractions Prescribed: 15
Plan Total Prescribed Dose: 30 Gy
Reference Point Dosage Given to Date: 4 Gy
Reference Point Session Dosage Given: 2 Gy
Session Number: 27

## 2023-01-10 ENCOUNTER — Ambulatory Visit
Admission: RE | Admit: 2023-01-10 | Discharge: 2023-01-10 | Disposition: A | Discharge status: Home / Self Care | Payer: No Typology Code available for payment source | Source: Ambulatory Visit | Attending: Radiation Oncology

## 2023-01-10 ENCOUNTER — Other Ambulatory Visit: Payer: Self-pay

## 2023-01-10 DIAGNOSIS — R310 Gross hematuria: Secondary | ICD-10-CM | POA: Diagnosis not present

## 2023-01-10 LAB — RAD ONC ARIA SESSION SUMMARY
Course Elapsed Days: 40
Plan Fractions Treated to Date: 3
Plan Prescribed Dose Per Fraction: 2 Gy
Plan Total Fractions Prescribed: 15
Plan Total Prescribed Dose: 30 Gy
Reference Point Dosage Given to Date: 6 Gy
Reference Point Session Dosage Given: 2 Gy
Session Number: 28

## 2023-01-11 ENCOUNTER — Other Ambulatory Visit: Payer: Self-pay

## 2023-01-11 ENCOUNTER — Ambulatory Visit
Admission: RE | Admit: 2023-01-11 | Discharge: 2023-01-11 | Disposition: A | Discharge status: Home / Self Care | Payer: No Typology Code available for payment source | Source: Ambulatory Visit | Attending: Radiation Oncology | Admitting: Radiation Oncology

## 2023-01-11 DIAGNOSIS — R310 Gross hematuria: Secondary | ICD-10-CM | POA: Diagnosis not present

## 2023-01-11 LAB — RAD ONC ARIA SESSION SUMMARY
Course Elapsed Days: 41
Plan Fractions Treated to Date: 4
Plan Prescribed Dose Per Fraction: 2 Gy
Plan Total Fractions Prescribed: 15
Plan Total Prescribed Dose: 30 Gy
Reference Point Dosage Given to Date: 8 Gy
Reference Point Session Dosage Given: 2 Gy
Session Number: 29

## 2023-01-12 ENCOUNTER — Ambulatory Visit
Admission: RE | Admit: 2023-01-12 | Discharge: 2023-01-12 | Disposition: A | Discharge status: Home / Self Care | Payer: No Typology Code available for payment source | Source: Ambulatory Visit | Attending: Radiation Oncology | Admitting: Radiation Oncology

## 2023-01-12 ENCOUNTER — Other Ambulatory Visit: Payer: Self-pay

## 2023-01-12 DIAGNOSIS — R310 Gross hematuria: Secondary | ICD-10-CM | POA: Diagnosis not present

## 2023-01-12 LAB — RAD ONC ARIA SESSION SUMMARY
Course Elapsed Days: 42
Plan Fractions Treated to Date: 5
Plan Prescribed Dose Per Fraction: 2 Gy
Plan Total Fractions Prescribed: 15
Plan Total Prescribed Dose: 30 Gy
Reference Point Dosage Given to Date: 10 Gy
Reference Point Session Dosage Given: 2 Gy
Session Number: 30

## 2023-01-13 ENCOUNTER — Ambulatory Visit
Admission: RE | Admit: 2023-01-13 | Discharge: 2023-01-13 | Disposition: A | Discharge status: Home / Self Care | Payer: No Typology Code available for payment source | Source: Ambulatory Visit | Attending: Radiation Oncology

## 2023-01-13 ENCOUNTER — Other Ambulatory Visit: Payer: Self-pay

## 2023-01-13 DIAGNOSIS — R310 Gross hematuria: Secondary | ICD-10-CM | POA: Diagnosis not present

## 2023-01-13 LAB — RAD ONC ARIA SESSION SUMMARY
Course Elapsed Days: 43
Plan Fractions Treated to Date: 6
Plan Prescribed Dose Per Fraction: 2 Gy
Plan Total Fractions Prescribed: 15
Plan Total Prescribed Dose: 30 Gy
Reference Point Dosage Given to Date: 12 Gy
Reference Point Session Dosage Given: 2 Gy
Session Number: 31

## 2023-01-16 ENCOUNTER — Ambulatory Visit
Admission: RE | Admit: 2023-01-16 | Discharge: 2023-01-16 | Disposition: A | Discharge status: Home / Self Care | Payer: No Typology Code available for payment source | Source: Ambulatory Visit | Attending: Radiation Oncology

## 2023-01-16 ENCOUNTER — Other Ambulatory Visit: Payer: Self-pay

## 2023-01-16 DIAGNOSIS — R310 Gross hematuria: Secondary | ICD-10-CM | POA: Diagnosis not present

## 2023-01-16 LAB — RAD ONC ARIA SESSION SUMMARY
Course Elapsed Days: 46
Plan Fractions Treated to Date: 7
Plan Prescribed Dose Per Fraction: 2 Gy
Plan Total Fractions Prescribed: 15
Plan Total Prescribed Dose: 30 Gy
Reference Point Dosage Given to Date: 14 Gy
Reference Point Session Dosage Given: 2 Gy
Session Number: 32

## 2023-01-17 ENCOUNTER — Ambulatory Visit
Admission: RE | Admit: 2023-01-17 | Discharge: 2023-01-17 | Disposition: A | Discharge status: Home / Self Care | Payer: No Typology Code available for payment source | Source: Ambulatory Visit | Attending: Radiation Oncology | Admitting: Radiation Oncology

## 2023-01-17 ENCOUNTER — Other Ambulatory Visit: Payer: Self-pay

## 2023-01-17 DIAGNOSIS — R310 Gross hematuria: Secondary | ICD-10-CM | POA: Diagnosis not present

## 2023-01-17 LAB — RAD ONC ARIA SESSION SUMMARY
Course Elapsed Days: 47
Plan Fractions Treated to Date: 8
Plan Prescribed Dose Per Fraction: 2 Gy
Plan Total Fractions Prescribed: 15
Plan Total Prescribed Dose: 30 Gy
Reference Point Dosage Given to Date: 16 Gy
Reference Point Session Dosage Given: 2 Gy
Session Number: 33

## 2023-01-18 ENCOUNTER — Other Ambulatory Visit: Payer: Self-pay

## 2023-01-18 ENCOUNTER — Ambulatory Visit
Admission: RE | Admit: 2023-01-18 | Discharge: 2023-01-18 | Disposition: A | Discharge status: Home / Self Care | Payer: No Typology Code available for payment source | Source: Ambulatory Visit | Attending: Radiation Oncology | Admitting: Radiation Oncology

## 2023-01-18 DIAGNOSIS — R310 Gross hematuria: Secondary | ICD-10-CM | POA: Diagnosis not present

## 2023-01-18 LAB — RAD ONC ARIA SESSION SUMMARY
Course Elapsed Days: 48
Plan Fractions Treated to Date: 9
Plan Prescribed Dose Per Fraction: 2 Gy
Plan Total Fractions Prescribed: 15
Plan Total Prescribed Dose: 30 Gy
Reference Point Dosage Given to Date: 18 Gy
Reference Point Session Dosage Given: 2 Gy
Session Number: 34

## 2023-01-19 ENCOUNTER — Ambulatory Visit: Payer: No Typology Code available for payment source

## 2023-01-19 ENCOUNTER — Other Ambulatory Visit: Payer: Self-pay

## 2023-01-19 ENCOUNTER — Ambulatory Visit
Admission: RE | Admit: 2023-01-19 | Discharge: 2023-01-19 | Disposition: A | Discharge status: Home / Self Care | Payer: No Typology Code available for payment source | Source: Ambulatory Visit | Attending: Radiation Oncology | Admitting: Radiation Oncology

## 2023-01-19 DIAGNOSIS — R310 Gross hematuria: Secondary | ICD-10-CM | POA: Diagnosis not present

## 2023-01-19 LAB — RAD ONC ARIA SESSION SUMMARY
Course Elapsed Days: 49
Plan Fractions Treated to Date: 10
Plan Prescribed Dose Per Fraction: 2 Gy
Plan Total Fractions Prescribed: 15
Plan Total Prescribed Dose: 30 Gy
Reference Point Dosage Given to Date: 20 Gy
Reference Point Session Dosage Given: 2 Gy
Session Number: 35

## 2023-01-20 ENCOUNTER — Ambulatory Visit
Admission: RE | Admit: 2023-01-20 | Discharge: 2023-01-20 | Disposition: A | Discharge status: Home / Self Care | Payer: No Typology Code available for payment source | Source: Ambulatory Visit | Attending: Radiation Oncology

## 2023-01-20 ENCOUNTER — Other Ambulatory Visit: Payer: Self-pay

## 2023-01-20 DIAGNOSIS — R310 Gross hematuria: Secondary | ICD-10-CM | POA: Diagnosis not present

## 2023-01-20 LAB — RAD ONC ARIA SESSION SUMMARY
Course Elapsed Days: 50
Plan Fractions Treated to Date: 11
Plan Prescribed Dose Per Fraction: 2 Gy
Plan Total Fractions Prescribed: 15
Plan Total Prescribed Dose: 30 Gy
Reference Point Dosage Given to Date: 22 Gy
Reference Point Session Dosage Given: 2 Gy
Session Number: 36

## 2023-01-23 ENCOUNTER — Other Ambulatory Visit: Payer: Self-pay

## 2023-01-23 ENCOUNTER — Ambulatory Visit
Admission: RE | Admit: 2023-01-23 | Discharge: 2023-01-23 | Disposition: A | Discharge status: Home / Self Care | Payer: No Typology Code available for payment source | Source: Ambulatory Visit | Attending: Radiation Oncology | Admitting: Radiation Oncology

## 2023-01-23 DIAGNOSIS — R310 Gross hematuria: Secondary | ICD-10-CM | POA: Diagnosis not present

## 2023-01-23 LAB — RAD ONC ARIA SESSION SUMMARY
Course Elapsed Days: 53
Plan Fractions Treated to Date: 12
Plan Prescribed Dose Per Fraction: 2 Gy
Plan Total Fractions Prescribed: 15
Plan Total Prescribed Dose: 30 Gy
Reference Point Dosage Given to Date: 24 Gy
Reference Point Session Dosage Given: 2 Gy
Session Number: 37

## 2023-01-24 ENCOUNTER — Ambulatory Visit
Admission: RE | Admit: 2023-01-24 | Discharge: 2023-01-24 | Disposition: A | Discharge status: Home / Self Care | Payer: No Typology Code available for payment source | Source: Ambulatory Visit | Attending: Radiation Oncology | Admitting: Radiation Oncology

## 2023-01-24 ENCOUNTER — Other Ambulatory Visit: Payer: Self-pay

## 2023-01-24 DIAGNOSIS — R310 Gross hematuria: Secondary | ICD-10-CM | POA: Diagnosis not present

## 2023-01-24 LAB — RAD ONC ARIA SESSION SUMMARY
Course Elapsed Days: 54
Plan Fractions Treated to Date: 13
Plan Prescribed Dose Per Fraction: 2 Gy
Plan Total Fractions Prescribed: 15
Plan Total Prescribed Dose: 30 Gy
Reference Point Dosage Given to Date: 26 Gy
Reference Point Session Dosage Given: 2 Gy
Session Number: 38

## 2023-01-25 ENCOUNTER — Other Ambulatory Visit: Payer: Self-pay

## 2023-01-25 ENCOUNTER — Ambulatory Visit
Admission: RE | Admit: 2023-01-25 | Discharge: 2023-01-25 | Discharge status: Home / Self Care | Payer: No Typology Code available for payment source | Source: Ambulatory Visit | Attending: Radiation Oncology

## 2023-01-25 DIAGNOSIS — R310 Gross hematuria: Secondary | ICD-10-CM | POA: Diagnosis not present

## 2023-01-25 LAB — RAD ONC ARIA SESSION SUMMARY
Course Elapsed Days: 55
Plan Fractions Treated to Date: 14
Plan Prescribed Dose Per Fraction: 2 Gy
Plan Total Fractions Prescribed: 15
Plan Total Prescribed Dose: 30 Gy
Reference Point Dosage Given to Date: 28 Gy
Reference Point Session Dosage Given: 2 Gy
Session Number: 39

## 2023-01-26 ENCOUNTER — Ambulatory Visit
Admission: RE | Admit: 2023-01-26 | Discharge: 2023-01-26 | Disposition: A | Discharge status: Home / Self Care | Payer: No Typology Code available for payment source | Source: Ambulatory Visit | Attending: Radiation Oncology | Admitting: Radiation Oncology

## 2023-01-26 ENCOUNTER — Other Ambulatory Visit: Payer: Self-pay

## 2023-01-26 DIAGNOSIS — R310 Gross hematuria: Secondary | ICD-10-CM | POA: Diagnosis not present

## 2023-01-26 LAB — RAD ONC ARIA SESSION SUMMARY
Course Elapsed Days: 56
Plan Fractions Treated to Date: 15
Plan Prescribed Dose Per Fraction: 2 Gy
Plan Total Fractions Prescribed: 15
Plan Total Prescribed Dose: 30 Gy
Reference Point Dosage Given to Date: 30 Gy
Reference Point Session Dosage Given: 2 Gy
Session Number: 40

## 2023-01-27 NOTE — Radiation Completion Notes (Addendum)
  Radiation Oncology         [phone] ________________________________  Name: [name] MRN: [id number]  Date: 01/26/2023  DOB: [birth year]  End of Treatment Note  Patient Name: Cory Ramos, Cory Ramos MRN: [id number] Date of Birth: February 11, 1946 Referring Physician: Rhoderick Moody, M.D. Date of Service: 2023-01-27 Radiation Oncologist: Margaretmary Bayley, M.D. [facility]     RADIATION ONCOLOGY END OF TREATMENT NOTE     Diagnosis: 77 y.o. gentleman with Stage T1c adenocarcinoma of the prostate with Gleason score of 4+4, and PSA of 20.60.   Intent: Curative     ==========DELIVERED PLANS==========  First Treatment Date: 2022-12-01 - Last Treatment Date: 2023-01-26   Plan Name: Prostate_Pelv Site: Prostate Technique: IMRT Mode: Photon Dose Per Fraction: 1.8 Gy Prescribed Dose (Delivered / Prescribed): 45 Gy / 45 Gy Prescribed Fxs (Delivered / Prescribed): 25 / 25   Plan Name: Prostate_Bst Site: Prostate Technique: IMRT Mode: Photon Dose Per Fraction: 2 Gy Prescribed Dose (Delivered / Prescribed): 30 Gy / 30 Gy Prescribed Fxs (Delivered / Prescribed): 15 / 15     ==========ON TREATMENT VISIT DATES========== 2022-12-02, 2022-12-08, 2022-12-15, 2022-12-23, 2022-12-29, 2023-01-06, 2023-01-13, 2023-01-19, 2023-01-26     See weekly On Treatment Notes in Epic for details. He tolerated the treatments well with mild urinary symptoms and modest fatigue. He did report increased nocturia 3-4x/night and managed this with flomax daily.  The patient will receive a call in about one month from the radiation oncology department. He will continue follow up with his urologist, Dr. Liliane Shi, as well.  ------------------------------------------------   Margaretmary Dys, MD [facility]  Radiation Oncology Direct Dial: [phone]  Fax: [phone] [email or web address]  Skype  LinkedIn

## 2023-02-02 NOTE — Progress Notes (Signed)
Patient was a RadOnc Consult on 09/26/22 for his stage T1c adenocarcinoma of the prostate with Gleason score of 4+4, and PSA of 20.60. Patient proceed with treatment recommendations of ADT (started 2/29) followed by 8 weeks of daily radiation and had his final radiation treatment on 01/26/2023.   Patient is scheduled for a post treatment nurse call on 03/07/23 and has his first post treatment PSA on 03/16/23 at [facility].

## 2023-02-24 ENCOUNTER — Other Ambulatory Visit: Payer: Self-pay | Admitting: Urology

## 2023-02-24 DIAGNOSIS — C61 Malignant neoplasm of prostate: Secondary | ICD-10-CM

## 2023-03-07 ENCOUNTER — Ambulatory Visit
Admission: RE | Admit: 2023-03-07 | Discharge: 2023-03-07 | Disposition: A | Discharge status: Home / Self Care | Payer: No Typology Code available for payment source | Source: Ambulatory Visit | Attending: Radiation Oncology | Admitting: Radiation Oncology

## 2023-03-07 DIAGNOSIS — R310 Gross hematuria: Secondary | ICD-10-CM | POA: Insufficient documentation

## 2023-03-07 NOTE — Progress Notes (Signed)
  Radiation Oncology         [phone] ________________________________  Name: Cory Ramos MRN: [id number]  Date of Service: 03/07/2023  DOB: [birth year]  Post Treatment Telephone Note  Diagnosis:  77 y.o. gentleman with Stage T1c adenocarcinoma of the prostate with Gleason score of 4+4, and PSA of 20.60.   (as documented in provider EOT note)  Pre Treatment IPSS Score: 6 (as documented in the provider consult note)  The patient was available for call today.   Symptoms of fatigue have improved mildly since completing therapy.  Symptoms of bladder changes have improved since completing therapy. Current symptoms include urinary urgency, and medications for bladder symptoms include Tamsulosin.  Symptoms of bowel changes have improved since completing therapy. Current symptoms include none, and medications for bowel symptoms include none.   Post Treatment IPSS Score: IPSS Questionnaire (AUA-7): Over the past month.   1)  How often have you had a sensation of not emptying your bladder completely after you finish urinating?  1 - Less than 1 time in 5  2)  How often have you had to urinate again less than two hours after you finished urinating? 1 - Less than 1 time in 5  3)  How often have you found you stopped and started again several times when you urinated?  0 - Not at all  4) How difficult have you found it to postpone urination?  4 - More than half the time  5) How often have you had a weak urinary stream?  4 - More than half the time  6) How often have you had to push or strain to begin urination?  0 - Not at all  7) How many times did you most typically get up to urinate from the time you went to bed until the time you got up in the morning?  3 - 3 times  Total score:  13. Which indicates moderate symptoms  0-7 mildly symptomatic   8-19 moderately symptomatic   20-35 severely symptomatic    Patient has a scheduled follow up visit with his urologist, Dr. Liliane Shi, on 04/13/2023  for ongoing surveillance. He was counseled that PSA levels will be drawn in the urology office, and was reassured that additional time is expected to improve bowel and bladder symptoms. He was encouraged to call back with concerns or questions regarding radiation.   This concludes the interaction.  Ruel Favors, LPN

## 2023-03-21 ENCOUNTER — Encounter: Payer: Self-pay | Admitting: *Deleted

## 2023-04-20 ENCOUNTER — Inpatient Hospital Stay: Payer: No Typology Code available for payment source | Attending: Adult Health | Admitting: *Deleted

## 2023-04-20 ENCOUNTER — Encounter: Payer: Self-pay | Admitting: *Deleted

## 2023-04-20 DIAGNOSIS — C61 Malignant neoplasm of prostate: Secondary | ICD-10-CM

## 2023-04-20 NOTE — Progress Notes (Signed)
SCP reviewed and completed. 

## 2023-12-26 ENCOUNTER — Encounter (HOSPITAL_COMMUNITY): Payer: Self-pay

## 2023-12-26 ENCOUNTER — Emergency Department (HOSPITAL_COMMUNITY)
Admission: EM | Admit: 2023-12-26 | Discharge: 2023-12-26 | Disposition: A | Discharge status: Home / Self Care | Attending: Emergency Medicine | Admitting: Emergency Medicine

## 2023-12-26 ENCOUNTER — Emergency Department (HOSPITAL_COMMUNITY)

## 2023-12-26 ENCOUNTER — Other Ambulatory Visit: Payer: Self-pay

## 2023-12-26 DIAGNOSIS — M79661 Pain in right lower leg: Secondary | ICD-10-CM | POA: Diagnosis present

## 2023-12-26 DIAGNOSIS — W19XXXA Unspecified fall, initial encounter: Secondary | ICD-10-CM | POA: Diagnosis not present

## 2023-12-26 DIAGNOSIS — N189 Chronic kidney disease, unspecified: Secondary | ICD-10-CM | POA: Insufficient documentation

## 2023-12-26 DIAGNOSIS — I129 Hypertensive chronic kidney disease with stage 1 through stage 4 chronic kidney disease, or unspecified chronic kidney disease: Secondary | ICD-10-CM | POA: Diagnosis not present

## 2023-12-26 DIAGNOSIS — F0280 Dementia in other diseases classified elsewhere without behavioral disturbance: Secondary | ICD-10-CM | POA: Diagnosis not present

## 2023-12-26 DIAGNOSIS — G309 Alzheimer's disease, unspecified: Secondary | ICD-10-CM | POA: Insufficient documentation

## 2023-12-26 DIAGNOSIS — E1122 Type 2 diabetes mellitus with diabetic chronic kidney disease: Secondary | ICD-10-CM | POA: Diagnosis not present

## 2023-12-26 DIAGNOSIS — Z79899 Other long term (current) drug therapy: Secondary | ICD-10-CM | POA: Diagnosis not present

## 2023-12-26 DIAGNOSIS — Z7982 Long term (current) use of aspirin: Secondary | ICD-10-CM | POA: Insufficient documentation

## 2023-12-26 DIAGNOSIS — Z8546 Personal history of malignant neoplasm of prostate: Secondary | ICD-10-CM | POA: Diagnosis not present

## 2023-12-26 DIAGNOSIS — Y92019 Unspecified place in single-family (private) house as the place of occurrence of the external cause: Secondary | ICD-10-CM | POA: Insufficient documentation

## 2023-12-26 DIAGNOSIS — M79604 Pain in right leg: Secondary | ICD-10-CM

## 2023-12-26 NOTE — ED Provider Notes (Signed)
 [facility] AT [facility] Provider Note   CSN: [id number] Arrival date & time: 12/26/23  1236     History  Chief Complaint  Patient presents with   Fall   Leg Pain    Cory Ramos is a 78 y.o. male.  Patient with history of prostate cancer, Alzheimer's dementia, hypertension, diabetes, chronic kidney disease-- presents with wife today for evaluation for evaluation of ongoing right lower extremity pain.  This has been occurring for approximately 1 month.  Patient was seen at the [place], had an ultrasound done which was reportedly negative.  He was prescribed tizanidine but could not tolerate this due to hallucinations.  Currently taking Tylenol .  Pain causes him to fall at times because his leg "gives out".  Last fall was 2 days ago.  Wife reports mild swelling of the knee in that time and an abrasion but no severe injury.  Patient asked to come to the hospital today which is unlike him.  He does have a h/o gout, but pain is typically between the ankle and knee.        Home Medications Prior to Admission medications   Medication Sig Start Date End Date Taking? Authorizing Provider  acetaminophen  (TYLENOL ) 325 MG tablet Take 650 mg by mouth every 6 (six) hours as needed for headache (pain).    [provider]  amLODipine-olmesartan (AZOR) 5-20 MG tablet Take 1 tablet by mouth every morning.    [provider]  aspirin  EC 81 MG tablet Take 81 mg by mouth every morning.    [provider]  capsaicin (ZOSTRIX) 0.025 % cream APPLY SMALL AMOUNT TO AFFECTED AREA AS DIRECTED BY YOUR PROVIDER Patient not taking: Reported on 04/20/2023 09/13/22   [provider]  carboxymethylcellulose 1 % ophthalmic solution Place 1 drop into both eyes at bedtime. Refresh    [provider]  colesevelam  (WELCHOL ) 625 MG tablet Take 2,500 mg by mouth daily. Pt takes 4 tablets a day 03/14/23   [provider]  ezetimibe  (ZETIA ) 10 MG  tablet Take 10 mg by mouth every morning.    [provider]  ferrous sulfate 325 (65 FE) MG EC tablet Take 1 tablet by mouth every other day. Medication sometimes constipates pt takes Magnesium with it 09/14/22   [provider]  furosemide  (LASIX ) 40 MG tablet Take 0.5 tablets (20 mg total) by mouth daily. Patient taking differently: Take 40 mg by mouth daily. 08/17/20   Mordechai April, DO  galantamine (RAZADYNE ER) 24 MG 24 hr capsule TAKE ONE CAPSULE BY MOUTH ONCE DAILY TO SLOW MEMORY LOSS; REPLACES DONEPEZIL . NOT A DOSE CHANGE 03/02/22   [provider]  glucose blood test strip USE 1 STRIP FOR TESTING BLOOD SUGAR CHECKS AS DIRECTED 03/15/22   [provider]  lidocaine  (LIDODERM ) 5 % As needed 09/13/22   [provider]  loratadine (CLARITIN) 10 MG tablet TAKE ONE TABLET BY MOUTH EVERY MORNING - TAKE BY MOUTH ONCE A DAY FOR 10 DAYS THEN TAKE 1 TABLET ONCE A DAY AS NEEDED FOR ALLERGIES 03/15/22   [provider]  Magnesium 500 MG TABS Take 500 mg by mouth daily as needed (constipation).    [provider]  memantine  (NAMENDA ) 10 MG tablet Take 20 mg by mouth at bedtime. For confusion/memory    [provider]  Multiple Vitamin (MULTIVITAMINS PO) Take 1 tablet by mouth daily. 06/15/06   [provider]  pantoprazole  (PROTONIX ) 40 MG tablet Take  40 mg by mouth daily as needed (acid reflux/heartburn).    [provider]  Propylene Glycol (SYSTANE BALANCE) 0.6 % SOLN Place 1 drop into both eyes 4 (four) times daily.    [provider]  Psyllium 27 % POWD TAKE 1 TABLESPOONFUL BY MOUTH ONCE A DAY (MIX IN 8 OUNCES OF WATER OR JUICE AND DRINK) 03/15/22   [provider]  tamsulosin  (FLOMAX ) 0.4 MG CAPS capsule Take 0.4 mg by mouth at bedtime.    [provider]  Vitamin D, Ergocalciferol, (DRISDOL) 50000 units CAPS capsule Take 50,000 Units by mouth every Wednesday.    [provider]       Allergies    Atorvastatin and Simvastatin    Review of Systems   Review of Systems  Physical Exam Updated Vital Signs BP (!) 156/69 (BP Location: Left Arm)   Pulse (!) 59   Temp 98.3 F (36.8 C) (Oral)   Resp 17   Ht 6' (1.829 m)   Wt 86.2 kg   SpO2 100%   BMI 25.77 kg/m   Physical Exam Vitals and nursing note reviewed.  Constitutional:      Appearance: He is well-developed.  HENT:     Head: Normocephalic and atraumatic.  Eyes:     Conjunctiva/sclera: Conjunctivae normal.  Pulmonary:     Effort: No respiratory distress.  Musculoskeletal:     Cervical back: Normal range of motion and neck supple.     Comments: Patient undressed and examined.  Pedal pulses intact, readily palpable, bilaterally.  Full passive range of motion of bilateral ankles without pain.  Patient reports some tenderness but does not wince or react when I push over the distal portion of the lower leg on the right.  No significant calf swelling.  There is a minimal abrasion over the area of the tibial tuberosity.  No effusion or swelling about the knee.  Patient is able to actively flex the knee and hip on the right side without difficulty.    Left lower extremity is otherwise normal.  Skin:    General: Skin is warm and dry.  Neurological:     Mental Status: He is alert.     ED Results / Procedures / Treatments   Labs (all labs ordered are listed, but only abnormal results are displayed) Labs Reviewed - No data to display  EKG None  Radiology DG Tibia/Fibula Right Result Date: 12/26/2023 CLINICAL DATA:  lower extremity pain. EXAM: RIGHT TIBIA AND FIBULA - 2 VIEW COMPARISON:  None Available. FINDINGS: No acute fracture or dislocation. No aggressive osseous lesion. There are mild degenerative changes of the knee joint. Ankle mortise appears intact. No focal soft tissue swelling. No radiopaque foreign bodies. IMPRESSION: *No acute osseous abnormality of the right tibia and fibula. Electronically  Signed   By: Beula Brunswick M.D.   On: 12/26/2023 13:51    Procedures Procedures    Medications Ordered in ED Medications - No data to display  ED Course/ Medical Decision Making/ A&P    Patient seen and examined. History obtained directly from wife and from medical record. Patient able to tell me if he hurts, but not able to contribute to history.   Labs/EKG: None ordered  Imaging: Will need x-ray  Medications/Fluids: None ordered  Most recent vital signs reviewed and are as follows: BP (!) 156/69 (BP Location: Left Arm)   Pulse (!) 59   Temp 98.3 F (36.8 C) (Oral)   Resp 17   Ht 6' (  1.829 m)   Wt 86.2 kg   SpO2 100%   BMI 25.77 kg/m   Initial impression: Right lower extremity pain, previously evaluated for DVT at the [place], this was reportedly negative.  2:10 PM Reassessment performed. Patient appears stable.  Imaging personally visualized and interpreted including: X-ray agree is negative for acute findings  Reviewed pertinent lab work and imaging with patient at bedside. Questions answered.   Most current vital signs reviewed and are as follows: BP (!) 156/69 (BP Location: Left Arm)   Pulse (!) 59   Temp 98.3 F (36.8 C) (Oral)   Resp 17   Ht 6' (1.829 m)   Wt 86.2 kg   SpO2 100%   BMI 25.77 kg/m   Plan: Discharge to home.   Prescriptions written for: None  Other home care instructions discussed: Protocol with compression and elevation  ED return instructions discussed: Worsening pain, redness, swelling, fevers  Follow-up instructions discussed: Patient encouraged to follow-up with their PCP in 7 days.                                   Medical Decision Making Amount and/or Complexity of Data Reviewed Radiology: ordered.   Patient with right leg pain.  This has been ongoing over the past month.  Leg appears neurovascularly intact.  He has readily palpable pedal pulses and I have low concern for claudication or arterial insufficiency.   Reportedly negative ultrasound for DVT and clinically patient does not have findings that are overly concerning for DVT.  Pain seems to be in the soft tissues of the lower leg but patient is poorly localizing the area of discomfort.  I do not see any joint swelling or redness or warmth which would suggest gouty arthritis or other inflammatory arthritis.  No sign of cellulitis or abscess.  Would advise continued basic treatments at this time.  The patient's vital signs, pertinent lab work and imaging were reviewed and interpreted as discussed in the ED course. Hospitalization was considered for further testing, treatments, or serial exams/observation. However as patient is well-appearing, has a stable exam, and reassuring studies today, I do not feel that they warrant admission at this time. This plan was discussed with the patient who verbalizes agreement and comfort with this plan and seems reliable and able to return to the Emergency Department with worsening or changing symptoms.          Final Clinical Impression(s) / ED Diagnoses Final diagnoses:  Right leg pain    Rx / DC Orders ED Discharge Orders     None         Lyna Sandhoff, PA-C 12/26/23 1412    Tegeler, Marine Sia, MD 12/27/23 [phone]

## 2023-12-26 NOTE — Discharge Instructions (Signed)
 Please read and follow all provided instructions.  Your diagnoses today include:  1. Right leg pain    Tests performed today include: An x-ray of the affected area - does NOT show any broken bones Vital signs. See below for your results today.   Medications prescribed:  None  Take any prescribed medications only as directed.  Home care instructions:  Follow any educational materials contained in this packet Follow R.I.C.E. Protocol: R - rest your injury  I  - use ice on injury without applying directly to skin C - compress injury with bandage or splint E - elevate the injury as much as possible  Follow-up instructions: Please follow-up with your primary care provider if you continue to have significant pain in 1 week.   Return instructions:  Please return if your toes or feet are numb or tingling, appear gray or blue, or you have severe pain (also elevate the leg and loosen splint or wrap if you were given one) Please return to the Emergency Department if you experience worsening symptoms.  Please return if you have any other emergent concerns.  Additional Information:  Your vital signs today were: BP (!) 156/69 (BP Location: Left Arm)   Pulse (!) 59   Temp 98.3 F (36.8 C) (Oral)   Resp 17   Ht 6' (1.829 m)   Wt 86.2 kg   SpO2 100%   BMI 25.77 kg/m  If your blood pressure (BP) was elevated above 135/85 this visit, please have this repeated by your doctor within one month. --------------

## 2023-12-26 NOTE — ED Notes (Signed)
 Pt medications found in room after discharge. Placed in belongings bag and labeled with pt sticker, placed in cabinet above 1-4 station. Left message on spouse phone to pick up.

## 2023-12-26 NOTE — ED Triage Notes (Addendum)
 PT arrives via POV with spouse. Spouse reports patient fell Sunday on the porch at home. States his right leg gave out, and he landed on his knee. No other injuries from the fall. Spouse states that he has been experiencing pain in his right lower leg since April 25th. Pt was seen at the [place] and ruled out a dvt. Pt was given tizanidine which helped with the pain, but caused him to have increased hallucinations. Pt has hx of dementia. Pt Awake, alert and oriented to name which is his baseline.

## 2024-01-05 ENCOUNTER — Other Ambulatory Visit: Payer: No Typology Code available for payment source

## 2024-03-27 ENCOUNTER — Other Ambulatory Visit: Payer: Self-pay | Admitting: Urology

## 2024-03-27 DIAGNOSIS — C61 Malignant neoplasm of prostate: Secondary | ICD-10-CM

## 2024-06-12 ENCOUNTER — Other Ambulatory Visit

## 2024-06-12 DIAGNOSIS — M8589 Other specified disorders of bone density and structure, multiple sites: Secondary | ICD-10-CM

## 2024-06-12 DIAGNOSIS — C61 Malignant neoplasm of prostate: Secondary | ICD-10-CM | POA: Diagnosis not present
